# Patient Record
Sex: Male | Born: 2000 | Race: White | Hispanic: No | Smoking: Never smoker
Health system: Southern US, Community
[De-identification: ages and names within clinical notes are randomized; demographics above are authoritative.]

## PROBLEM LIST (undated history)

## (undated) DIAGNOSIS — J45909 Unspecified asthma, uncomplicated: Secondary | ICD-10-CM

## (undated) DIAGNOSIS — T7840XA Allergy, unspecified, initial encounter: Secondary | ICD-10-CM

## (undated) HISTORY — PX: FRACTURE SURGERY: SHX138

## (undated) HISTORY — DX: Allergy, unspecified, initial encounter: T78.40XA

## (undated) HISTORY — DX: Unspecified asthma, uncomplicated: J45.909

---
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
## 2020-01-22 ENCOUNTER — Ambulatory Visit (INDEPENDENT_AMBULATORY_CARE_PROVIDER_SITE_OTHER): Payer: 59 | Admitting: Nurse Practitioner

## 2020-01-22 ENCOUNTER — Other Ambulatory Visit: Payer: Self-pay

## 2020-01-22 ENCOUNTER — Encounter: Payer: Self-pay | Admitting: Nurse Practitioner

## 2020-01-22 VITALS — BP 125/79 | HR 83 | Temp 97.2°F | Ht 72.0 in | Wt 219.0 lb

## 2020-01-22 DIAGNOSIS — Z7689 Persons encountering health services in other specified circumstances: Secondary | ICD-10-CM

## 2020-01-22 DIAGNOSIS — J309 Allergic rhinitis, unspecified: Secondary | ICD-10-CM | POA: Diagnosis not present

## 2020-01-22 DIAGNOSIS — J4540 Moderate persistent asthma, uncomplicated: Secondary | ICD-10-CM | POA: Insufficient documentation

## 2020-01-22 DIAGNOSIS — J4541 Moderate persistent asthma with (acute) exacerbation: Secondary | ICD-10-CM | POA: Insufficient documentation

## 2020-01-22 DIAGNOSIS — J45909 Unspecified asthma, uncomplicated: Secondary | ICD-10-CM | POA: Insufficient documentation

## 2020-01-22 DIAGNOSIS — J4530 Mild persistent asthma, uncomplicated: Secondary | ICD-10-CM | POA: Diagnosis not present

## 2020-01-22 DIAGNOSIS — T7840XA Allergy, unspecified, initial encounter: Secondary | ICD-10-CM | POA: Insufficient documentation

## 2020-01-22 DIAGNOSIS — K219 Gastro-esophageal reflux disease without esophagitis: Secondary | ICD-10-CM | POA: Diagnosis not present

## 2020-01-22 MED ORDER — MONTELUKAST SODIUM 10 MG PO TABS: 10.0000 mg | ORAL_TABLET | Freq: Every day | ORAL | 1 refills | Status: DC

## 2020-01-22 MED ORDER — PANTOPRAZOLE SODIUM 40 MG PO TBEC: 40.0000 mg | DELAYED_RELEASE_TABLET | Freq: Every day | ORAL | 1 refills | Status: DC

## 2020-01-22 MED ORDER — METHYLPREDNISOLONE 4 MG PO TBPK: ORAL_TABLET | ORAL | 0 refills | Status: DC

## 2020-01-22 NOTE — Progress Notes (Signed)
New Patient Office Visit  Subjective:  Patient ID: Brandon Bishop, male    DOB: 16-Mar-2000  Age: 20 y.o. MRN: [id number]  CC:  Chief Complaint  Patient presents with  . Establish Care    HPI Brandon Bishop presents for new patient visit to establish care.  Introduced to Designer, jewellery role and practice setting.  All questions answered.  Discussed provider/patient relationship and expectations.   GERD Reports he never had any issues until he was diagnosed with COVID in July 2020.  The daily PPI prevents a chronic cough. GERD control status: controlled Satisfied with current treatment? yes Heartburn frequency: none with medicine Medication side effects: no  Medication compliance: excellent Previous GERD medications: pantoprazole Antacid use frequency:  never Duration: never Heartburn duration: never Alleviatiating factors:  Taking PPI Aggravating factors: not taking PPI Dysphagia: no Odynophagia:  no  Hematemesis: no Blood in stool: no EGD: no  ASTHMA Asthma status: better Satisfied with current treatment?: yes Albuterol/rescue inhaler frequency: about once per week Dyspnea frequency: about once per week Wheezing frequency: no Cough frequency: daily Nocturnal symptom frequency: yes; when lay down Limitation of activity: sometimes Current upper respiratory symptoms: no Aerochamber/spacer use: yes Visits to ER or Urgent Care in past year: no Pneumovax: Not up to Date Influenza: Not up to Date  Past Medical History:  Diagnosis Date  . Allergy    [facility]ia 01/21/2020  . Asthma    [facility]ia 01/21/2020    Past Surgical History:  Procedure Laterality Date  . FRACTURE SURGERY N/A    sesmoid bone  . WISDOM TOOTH EXTRACTION  2018   all 4 removed    Family History  Problem Relation Age of Onset  . Hypertension Maternal Grandmother   . Diabetes Mellitus II Paternal Grandfather     Social History   Socioeconomic History  . Marital status: Single    Spouse  name: Not on file  . Number of children: Not on file  . Years of education: Not on file  . Highest education level: Not on file  Occupational History  . Not on file  Tobacco Use  . Smoking status: Never Smoker  . Smokeless tobacco: Never Used  Substance and Sexual Activity  . Alcohol use: Never  . Drug use: Never  . Sexual activity: Not Currently  Other Topics Concern  . Not on file  Social History Narrative  . Not on file   Social Determinants of Health   Financial Resource Strain: Not on file  Food Insecurity: Not on file  Transportation Needs: Not on file  Physical Activity: Not on file  Stress: Not on file  Social Connections: Not on file  Intimate Partner Violence: Not on file    ROS Review of Systems  Constitutional: Negative.   HENT: Negative.   Eyes: Negative.   Respiratory: Positive for cough. Negative for chest tightness, shortness of breath and wheezing.   Cardiovascular: Negative.  Negative for chest pain.  Gastrointestinal: Negative.   Skin: Negative.   Neurological: Negative.   Psychiatric/Behavioral: Negative.     Objective:   Today's Vitals: BP 125/79   Pulse 83   Temp (!) 97.2 F (36.2 C)   Ht 6' (1.829 m)   Wt 219 lb (99.3 kg)   SpO2 99%   BMI 29.70 kg/m   Physical Exam Vitals and nursing note reviewed.  Constitutional:      General: He is not in acute distress.    Appearance: Normal appearance. He is not toxic-appearing.  HENT:  Head: Normocephalic and atraumatic.     Right Ear: External ear normal.     Nose: Nose normal. No congestion.     Mouth/Throat:     Mouth: Mucous membranes are moist.     Pharynx: Oropharynx is clear.  Eyes:     General: No scleral icterus.    Extraocular Movements: Extraocular movements intact.  Cardiovascular:     Rate and Rhythm: Normal rate.     Heart sounds: Normal heart sounds. No murmur heard.   Pulmonary:     Effort: Pulmonary effort is normal. No respiratory distress.     Breath sounds:  Normal breath sounds. No wheezing, rhonchi or rales.  Abdominal:     General: Abdomen is flat. Bowel sounds are normal.  Musculoskeletal:     Cervical back: Normal range of motion.  Lymphadenopathy:     Cervical: No cervical adenopathy.  Skin:    General: Skin is warm and dry.     Capillary Refill: Capillary refill takes less than 2 seconds.     Coloration: Skin is not jaundiced or pale.     Findings: No erythema.  Neurological:     Mental Status: He is alert and oriented to person, place, and time.  Psychiatric:        Mood and Affect: Mood normal.        Behavior: Behavior normal.        Thought Content: Thought content normal.        Judgment: Judgment normal.     Assessment & Plan:   Problem List Items Addressed This Visit      Respiratory   Asthma - Primary    Chronic, ?current exacerbation with increased cough and use of rescue inhaler.  Will extend methylprednisolone dose pack and otherwise, continue regimen with flovent, SABA prn, and Singulair. May consider spirometry at future visit.  Return to clinic if rescue inhaler use does not decrease after steroids.  With any sudden onset of chest pain or shortness of breath, go to ER.      Relevant Medications   montelukast (SINGULAIR) 10 MG tablet   methylPREDNISolone (MEDROL DOSEPAK) 4 MG TBPK tablet   Allergic rhinitis    Chronic, stable.  Continue regimen of zyrtec/flonase.  If symptoms worsen, return to clinic.        Digestive   Gastroesophageal reflux disease    Chronic, stable on daily pantoprazole.  Refills given.  Discussed possibly tapering off in the future and using as needed to prevent future vitamin deficiencies.  Patient aware of triggers.  Return to clinic with any worsening symptoms.      Relevant Medications   pantoprazole (PROTONIX) 40 MG tablet    Other Visit Diagnoses    Encounter to establish care          Outpatient Encounter Medications as of 01/22/2020  Medication Sig  .  methylPREDNISolone (MEDROL DOSEPAK) 4 MG TBPK tablet Going to extend your dose pack.  Take 2 tonight.  Take 1 tomorrow before breakfast, 1 before lunch, 2 tomorrow night.  Same thing Sunday.  Monday, start taper down with original dose pack.  . cetirizine (ZYRTEC) 10 MG tablet Take 10 mg by mouth daily.  Marland Kitchen FLOVENT HFA 220 MCG/ACT inhaler Inhale 2 puffs into the lungs 2 (two) times daily.  . fluticasone (FLONASE) 50 MCG/ACT nasal spray Place 1 spray into both nostrils daily.  . montelukast (SINGULAIR) 10 MG tablet Take 1 tablet (10 mg total) by mouth daily.  . pantoprazole (PROTONIX)  40 MG tablet Take 1 tablet (40 mg total) by mouth daily.  . [DISCONTINUED] ARNUITY ELLIPTA 200 MCG/ACT AEPB Inhale 1 puff into the lungs daily.  . [DISCONTINUED] docusate sodium (COLACE) 100 MG capsule Take 1 capsule (100 mg total) by mouth 2 (two) times daily.  . [DISCONTINUED] ibuprofen (ADVIL) 800 MG tablet TAKE ONE TABLET BY MOUTH EVERY 8 HOURS AS NEEDED  . [DISCONTINUED] montelukast (SINGULAIR) 10 MG tablet Take 10 mg by mouth daily.  . [DISCONTINUED] pantoprazole (PROTONIX) 40 MG tablet Take 40 mg by mouth daily.  . [DISCONTINUED] promethazine (PHENERGAN) 12.5 MG tablet Take 1 tablet (12.5 mg total) by mouth every 8 (eight) hours as needed for nausea or vomiting.   No facility-administered encounter medications on file as of 01/22/2020.    Follow-up: Return in about 6 months (around 07/21/2020) for f/u with Dr. Tanya Nones.   Valentino Nose, NP

## 2020-01-22 NOTE — Patient Instructions (Signed)
Asthma Attack Prevention, Adult Although you may not be able to control the fact that you have asthma, you can take actions to prevent episodes of asthma (asthma attacks). These actions include:  Creating a written plan for managing and treating your asthma attacks (asthma action plan).  Monitoring your asthma.  Avoiding things that can irritate your airways or make your asthma symptoms worse (asthma triggers).  Taking your medicines as directed.  Acting quickly if you have signs or symptoms of an asthma attack. What are some ways to prevent an asthma attack? Create a plan Work with your health care provider to create an asthma action plan. This plan should include:  A list of your asthma triggers and how to avoid them.  A list of symptoms that you experience during an asthma attack.  Information about when to take medicine and how much medicine to take.  Information to help you understand your peak flow measurements.  Contact information for your health care providers.  Daily actions that you can take to control asthma. Monitor your asthma To monitor your asthma:  Use your peak flow meter every morning and every evening for 2-3 weeks. Record the results in a journal. A drop in your peak flow numbers on one or more days may mean that you are starting to have an asthma attack, even if you are not having symptoms.  When you have asthma symptoms, write them down in a journal.  Avoid asthma triggers Work with your health care provider to find out what your asthma triggers are. This can be done by:  Being tested for allergies.  Keeping a journal that notes when asthma attacks occur and what may have contributed to them.  Asking your health care provider whether other medical conditions make your asthma worse. Common asthma triggers include:  Dust.  Smoke. This includes campfire smoke and secondhand smoke from tobacco products.  Pet dander.  Trees, grasses or  pollens.  Very cold, dry, or humid air.  Mold.  Foods that contain high amounts of sulfites.  Strong smells.  Engine exhaust and air pollution.  Aerosol sprays and fumes from household cleaners.  Household pests and their droppings, including dust mites and cockroaches.  Certain medicines, including NSAIDs. Once you have determined your asthma triggers, take steps to avoid them. Depending on your triggers, you may be able to reduce the chance of an asthma attack by:  Keeping your home clean. Have someone dust and vacuum your home for you 1 or 2 times a week. If possible, have them use a high-efficiency particulate arrestance (HEPA) vacuum.  Washing your sheets weekly in hot water.  Using allergy-proof mattress covers and casings on your bed.  Keeping pets out of your home.  Taking care of mold and water problems in your home.  Avoiding areas where people smoke.  Avoiding using strong perfumes or odor sprays.  Avoid spending a lot of time outdoors when pollen counts are high and on very windy days.  Talking with your health care provider before stopping or starting any new medicines. Medicines Take over-the-counter and prescription medicines only as told by your health care provider. Many asthma attacks can be prevented by carefully following your medicine schedule. Taking your medicines correctly is especially important when you cannot avoid certain asthma triggers. Even if you are doing well, do not stop taking your medicine and do not take less medicine. Act quickly If an asthma attack happens, acting quickly can decrease how severe it is and   how long it lasts. Take these actions:  Pay attention to your symptoms. If you are coughing, wheezing, or having difficulty breathing, do not wait to see if your symptoms go away on their own. Follow your asthma action plan.  If you have followed your asthma action plan and your symptoms are not improving, call your health care  provider or seek immediate medical care at the nearest hospital. It is important to write down how often you need to use your fast-acting rescue inhaler. You can track how often you use an inhaler in your journal. If you are using your rescue inhaler more often, it may mean that your asthma is not under control. Adjusting your asthma treatment plan may help you to prevent future asthma attacks and help you to gain better control of your condition. How can I prevent an asthma attack when I exercise? Exercise is a common asthma trigger. To prevent asthma attacks during exercise:  Follow advice from your health care provider about whether you should use your fast-acting inhaler before exercising. Many people with asthma experience exercise-induced bronchoconstriction (EIB). This condition often worsens during vigorous exercise in cold, humid, or dry environments. Usually, people with EIB can stay very active by using a fast-acting inhaler before exercising.  Avoid exercising outdoors in very cold or humid weather.  Avoid exercising outdoors when pollen counts are high.  Warm up and cool down when exercising.  Stop exercising right away if asthma symptoms start. Consider taking part in exercises that are less likely to cause asthma symptoms such as:  Indoor swimming.  Biking.  Walking.  Hiking.  Playing football. This information is not intended to replace advice given to you by your health care provider. Make sure you discuss any questions you have with your health care provider. Document Revised: 12/14/2016 Document Reviewed: 06/18/2015 Elsevier Patient Education  2020 Elsevier Inc.  

## 2020-01-25 NOTE — Assessment & Plan Note (Signed)
Chronic, ?current exacerbation with increased cough and use of rescue inhaler.  Will extend methylprednisolone dose pack and otherwise, continue regimen with flovent, SABA prn, and Singulair. May consider spirometry at future visit.  Return to clinic if rescue inhaler use does not decrease after steroids.  With any sudden onset of chest pain or shortness of breath, go to ER.

## 2020-01-25 NOTE — Assessment & Plan Note (Signed)
Chronic, stable.  Continue regimen of zyrtec/flonase.  If symptoms worsen, return to clinic.

## 2020-01-25 NOTE — Assessment & Plan Note (Addendum)
Chronic, stable on daily pantoprazole.  Refills given.  Discussed possibly tapering off in the future and using as needed to prevent future vitamin deficiencies.  Patient aware of triggers.  Return to clinic with any worsening symptoms.

## 2020-03-09 ENCOUNTER — Encounter: Payer: Self-pay | Admitting: Family Medicine

## 2020-03-10 ENCOUNTER — Encounter: Payer: Self-pay | Admitting: Family Medicine

## 2020-05-23 ENCOUNTER — Encounter: Payer: Self-pay | Admitting: Nurse Practitioner

## 2020-05-23 ENCOUNTER — Other Ambulatory Visit: Payer: Self-pay

## 2020-05-23 ENCOUNTER — Ambulatory Visit (INDEPENDENT_AMBULATORY_CARE_PROVIDER_SITE_OTHER): Payer: 59 | Admitting: Nurse Practitioner

## 2020-05-23 VITALS — BP 120/84 | HR 97 | Temp 98.1°F | Ht 72.04 in | Wt 228.8 lb

## 2020-05-23 DIAGNOSIS — J029 Acute pharyngitis, unspecified: Secondary | ICD-10-CM

## 2020-05-23 DIAGNOSIS — K219 Gastro-esophageal reflux disease without esophagitis: Secondary | ICD-10-CM

## 2020-05-23 DIAGNOSIS — J4530 Mild persistent asthma, uncomplicated: Secondary | ICD-10-CM | POA: Diagnosis not present

## 2020-05-23 DIAGNOSIS — J189 Pneumonia, unspecified organism: Secondary | ICD-10-CM

## 2020-05-23 DIAGNOSIS — J309 Allergic rhinitis, unspecified: Secondary | ICD-10-CM

## 2020-05-23 HISTORY — DX: Pneumonia, unspecified organism: J18.9

## 2020-05-23 HISTORY — DX: Acute pharyngitis, unspecified: J02.9

## 2020-05-23 MED ORDER — FLOVENT HFA 220 MCG/ACT IN AERO: 2.0000 | INHALATION_SPRAY | Freq: Two times a day (BID) | RESPIRATORY_TRACT | 2 refills | Status: DC

## 2020-05-23 MED ORDER — PANTOPRAZOLE SODIUM 40 MG PO TBEC: 40.0000 mg | DELAYED_RELEASE_TABLET | Freq: Every day | ORAL | 1 refills | Status: DC

## 2020-05-23 MED ORDER — ALBUTEROL SULFATE (2.5 MG/3ML) 0.083% IN NEBU: 2.5000 mg | INHALATION_SOLUTION | Freq: Four times a day (QID) | RESPIRATORY_TRACT | 2 refills | Status: DC

## 2020-05-23 MED ORDER — AMOXICILLIN-POT CLAVULANATE 875-125 MG PO TABS: 1.0000 | ORAL_TABLET | Freq: Two times a day (BID) | ORAL | 0 refills | Status: AC

## 2020-05-23 MED ORDER — MONTELUKAST SODIUM 10 MG PO TABS: 10.0000 mg | ORAL_TABLET | Freq: Every day | ORAL | 1 refills | Status: DC

## 2020-05-23 NOTE — Assessment & Plan Note (Signed)
Chronic.  Stable with use of daily Flovent and albuterol as needed.  Also taking taking Singulair-refills given given.  Follow-up in 6 months.

## 2020-05-23 NOTE — Assessment & Plan Note (Signed)
Chronic.  Stable with use of Zyrtec and Flonase regimen.  Continue these medications.  Return to clinic if symptoms worsen.

## 2020-05-23 NOTE — Assessment & Plan Note (Signed)
Acute.  Unclear etiology-likely either strep throat versus viral pharyngitis.  Strep culture obtained and respiratory panel obtained.  Given white exudate on tonsils, erythematous auditory canals bilaterally, and ongoing fever for more than 3 days, suspected bacterial.  We will treat with Augmentin twice daily for 10 days.  Follow-up near end of week if symptoms or not improving-he leaves to go out of the country for 3 months next week, prefer to be fully treated prior to then.

## 2020-05-23 NOTE — Assessment & Plan Note (Signed)
Do not have access to records from [facility]-however, is due for repeat chest x-ray to ensure pneumonia has fully resolved.  We will obtain this today.

## 2020-05-23 NOTE — Assessment & Plan Note (Signed)
Chronic.  Stable on pantoprazole 40 mg daily.  We will continue this medication for now-refills given.  Follow-up in 6 months.

## 2020-05-23 NOTE — Progress Notes (Signed)
Subjective:    Patient ID: Brandon Bishop, male    DOB: [birth year], 20 y.o.   MRN: [id number]  HPI: Brandon Bishop is a 20 y.o. male presenting for sore throat.   Chief Complaint  Patient presents with  . Sore Throat    Fever for the past 2 days. Home test - 2 days ago  . Coronary Artery Disease   UPPER RESPIRATORY TRACT INFECTION COVID testing history: negative at home test COVID vaccination status: fully vaccinated with Pfizer Onset: Fever: started Saturday 101.9  Cough: no Shortness of breath: no Wheezing: no Chest pain: no Chest tightness: yes; worse at night time sort of Chest congestion: no Nasal congestion: yes Runny nose: no Post nasal drip: yes Sneezing: no Sore throat: yes Swollen glands: yes Sinus pressure: yes; maxillary Headache: yes Face pain: no Toothache: yes, when drinking cold water Ear pain: no  Ear pressure: no  Eyes red/itching:no Eye drainage/crusting: no  Nausea: yes  Vomiting: no Diarrhea: yes  Change in appetite: yes; throat hurts too much  Loss of taste/smell: no  Rash: no Fatigue: yes Sick contacts: no Strep contacts: no  Context: better Recurrent sinusitis: no Treatments attempted: Ibuprofen,  Relief with OTC medications: yes  Patient reports about 1 month ago, he had a cough at college and was found to have bilateral pneumonia.  He was treated with amoxicillin for this he reports an his symptoms got all of the way better.  Patient also reports he is going out of the country to [place] for the summer next Wednesday and is asking for refills of all of his medication.  Allergies  Allergen Reactions  . Other   . Dust Mite Extract   . Grass Pollen(K-O-R-T-Swt Vern)   . Mixed Feathers   . Molds & Smuts     Outpatient Encounter Medications as of 05/23/2020  Medication Sig  . amoxicillin-clavulanate (AUGMENTIN) 875-125 MG tablet Take 1 tablet by mouth 2 (two) times daily for 10 days.  . cetirizine (ZYRTEC) 10 MG tablet Take 10 mg by  mouth daily.  . fluticasone (FLONASE) 50 MCG/ACT nasal spray Place 1 spray into both nostrils daily.  . [DISCONTINUED] albuterol (PROVENTIL) (2.5 MG/3ML) 0.083% nebulizer solution Take 2.5 mg by nebulization 4 (four) times daily.  . [DISCONTINUED] FLOVENT HFA 220 MCG/ACT inhaler Inhale 2 puffs into the lungs 2 (two) times daily.  . [DISCONTINUED] montelukast (SINGULAIR) 10 MG tablet Take 1 tablet (10 mg total) by mouth daily.  . [DISCONTINUED] pantoprazole (PROTONIX) 40 MG tablet Take 1 tablet (40 mg total) by mouth daily.  Marland Kitchen albuterol (PROVENTIL) (2.5 MG/3ML) 0.083% nebulizer solution Take 3 mLs (2.5 mg total) by nebulization 4 (four) times daily.  Marland Kitchen FLOVENT HFA 220 MCG/ACT inhaler Inhale 2 puffs into the lungs 2 (two) times daily.  . montelukast (SINGULAIR) 10 MG tablet Take 1 tablet (10 mg total) by mouth daily.  . pantoprazole (PROTONIX) 40 MG tablet Take 1 tablet (40 mg total) by mouth daily.  . [DISCONTINUED] methylPREDNISolone (MEDROL DOSEPAK) 4 MG TBPK tablet Going to extend your dose pack.  Take 2 tonight.  Take 1 tomorrow before breakfast, 1 before lunch, 2 tomorrow night.  Same thing Sunday.  Monday, start taper down with original dose pack.   No facility-administered encounter medications on file as of 05/23/2020.    Patient Active Problem List   Diagnosis Date Noted  . Sore throat 05/23/2020  . Pneumonia of both lungs due to infectious organism 05/23/2020  . Gastroesophageal reflux disease 01/22/2020  .  Asthma   . Allergic rhinitis     Past Medical History:  Diagnosis Date  . Allergy    [facility] 01/21/2020  . Asthma    [facility] 01/21/2020    Relevant past medical, surgical, family and social history reviewed and updated as indicated. Interim medical history since our last visit reviewed.  Review of Systems Per HPI unless specifically indicated above     Objective:    BP 120/84   Pulse 97   Temp 98.1 F (36.7 C)   Ht 6' 0.04" (1.83 m)   Wt 228 lb 12.8 oz (103.8  kg)   SpO2 97%   BMI 31.00 kg/m   Wt Readings from Last 3 Encounters:  05/23/20 228 lb 12.8 oz (103.8 kg) (98 %, Z= 2.05)*  01/22/20 219 lb (99.3 kg) (97 %, Z= 1.88)*   * Growth percentiles are based on CDC (Boys, 2-20 Years) data.    Physical Exam Vitals and nursing note reviewed.  Constitutional:      General: He is not in acute distress.    Appearance: He is well-developed. He is not toxic-appearing.  HENT:     Head: Normocephalic and atraumatic.     Right Ear: Tympanic membrane normal. Tympanic membrane is not erythematous.     Left Ear: Tympanic membrane normal. Tympanic membrane is not erythematous.     Ears:     Comments: Erythematous canals bilaterally    Nose: Congestion present. No rhinorrhea.     Mouth/Throat:     Mouth: Mucous membranes are moist.     Pharynx: Pharyngeal swelling, oropharyngeal exudate and posterior oropharyngeal erythema present.     Tonsils: Tonsillar exudate present.  Eyes:     Extraocular Movements:     Right eye: Normal extraocular motion.     Left eye: Normal extraocular motion.     Conjunctiva/sclera: Conjunctivae normal.  Neck:     Thyroid: No thyromegaly.  Cardiovascular:     Rate and Rhythm: Normal rate and regular rhythm.     Heart sounds: Normal heart sounds. No murmur heard.   Pulmonary:     Effort: Pulmonary effort is normal. No respiratory distress.     Breath sounds: Normal breath sounds. No wheezing, rhonchi or rales.  Abdominal:     General: Bowel sounds are normal.     Palpations: Abdomen is soft.  Musculoskeletal:     Cervical back: Normal range of motion and neck supple.  Lymphadenopathy:     Cervical: Cervical adenopathy present.  Skin:    General: Skin is warm.     Capillary Refill: Capillary refill takes less than 2 seconds.     Coloration: Skin is not pale.     Findings: No erythema or rash.  Neurological:     Mental Status: He is alert and oriented to person, place, and time.  Psychiatric:        Mood and  Affect: Mood normal.        Behavior: Behavior normal.    Results for orders placed or performed in visit on 05/23/20  STREP GROUP A AG, W/REFLEX TO CULT   Specimen: Throat  Result Value Ref Range   Streptococcus Group A AG NOT DETECTED NOT DETECTED      Assessment & Plan:   Problem List Items Addressed This Visit      Respiratory   Pneumonia of both lungs due to infectious organism    Do not have access to records from [employer]-however, is due for repeat chest x-ray to  ensure pneumonia has fully resolved.  We will obtain this today.      Relevant Medications   montelukast (SINGULAIR) 10 MG tablet   FLOVENT HFA 220 MCG/ACT inhaler   albuterol (PROVENTIL) (2.5 MG/3ML) 0.083% nebulizer solution   amoxicillin-clavulanate (AUGMENTIN) 875-125 MG tablet   Other Relevant Orders   DG Chest 2 View   Asthma    Chronic.  Stable with use of daily Flovent and albuterol as needed.  Also taking taking Singulair-refills given given.  Follow-up in 6 months.      Relevant Medications   montelukast (SINGULAIR) 10 MG tablet   FLOVENT HFA 220 MCG/ACT inhaler   albuterol (PROVENTIL) (2.5 MG/3ML) 0.083% nebulizer solution   Allergic rhinitis    Chronic.  Stable with use of Zyrtec and Flonase regimen.  Continue these medications.  Return to clinic if symptoms worsen.        Digestive   Gastroesophageal reflux disease    Chronic.  Stable on pantoprazole 40 mg daily.  We will continue this medication for now-refills given.  Follow-up in 6 months.      Relevant Medications   pantoprazole (PROTONIX) 40 MG tablet     Other   Sore throat - Primary    Acute.  Unclear etiology-likely either strep throat versus viral pharyngitis.  Strep culture obtained and respiratory panel obtained.  Given white exudate on tonsils, erythematous auditory canals bilaterally, and ongoing fever for more than 3 days, suspected bacterial.  We will treat with Augmentin twice daily for 10 days.  Follow-up near end of  week if symptoms or not improving-he leaves to go out of the country for 3 months next week, prefer to be fully treated prior to then.      Relevant Medications   amoxicillin-clavulanate (AUGMENTIN) 875-125 MG tablet   Other Relevant Orders   STREP GROUP A AG, W/REFLEX TO CULT (Completed)   SARS-CoV-2 RNA (COVID-19) and Respiratory Viral Panel, Qualitative NAAT       Follow up plan: Return for If symptoms not improving near the end of the week.

## 2020-05-24 ENCOUNTER — Other Ambulatory Visit: Payer: Self-pay

## 2020-05-24 NOTE — Telephone Encounter (Signed)
Ok to send albuterol inhaler to pt pharmacy?

## 2020-05-25 LAB — SARS-COV-2 RNA (COVID-19) RESP VIRAL PNL QL NAAT

## 2020-05-25 LAB — CULTURE, GROUP A STREP
MICRO NUMBER:: 11865512
SPECIMEN QUALITY:: ADEQUATE

## 2020-05-25 LAB — STREP GROUP A AG, W/REFLEX TO CULT: Streptococcus Group A AG: NOT DETECTED

## 2020-05-25 MED ORDER — ALBUTEROL SULFATE HFA 108 (90 BASE) MCG/ACT IN AERS: 2.0000 | INHALATION_SPRAY | Freq: Four times a day (QID) | RESPIRATORY_TRACT | 11 refills | Status: DC | PRN

## 2020-06-13 ENCOUNTER — Encounter: Payer: Self-pay | Admitting: Nurse Practitioner

## 2020-08-15 ENCOUNTER — Ambulatory Visit
Admission: RE | Admit: 2020-08-15 | Discharge: 2020-08-15 | Disposition: A | Discharge status: Home / Self Care | Payer: BLUE CROSS/BLUE SHIELD | Source: Ambulatory Visit | Attending: Nurse Practitioner | Admitting: Nurse Practitioner

## 2020-08-15 ENCOUNTER — Other Ambulatory Visit: Payer: Self-pay

## 2020-08-15 DIAGNOSIS — J189 Pneumonia, unspecified organism: Secondary | ICD-10-CM

## 2020-08-17 ENCOUNTER — Other Ambulatory Visit: Payer: Self-pay

## 2020-08-17 ENCOUNTER — Telehealth (INDEPENDENT_AMBULATORY_CARE_PROVIDER_SITE_OTHER): Payer: 59 | Admitting: Nurse Practitioner

## 2020-08-17 ENCOUNTER — Encounter: Payer: Self-pay | Admitting: Nurse Practitioner

## 2020-08-17 DIAGNOSIS — J4530 Mild persistent asthma, uncomplicated: Secondary | ICD-10-CM | POA: Diagnosis not present

## 2020-08-17 DIAGNOSIS — J0100 Acute maxillary sinusitis, unspecified: Secondary | ICD-10-CM

## 2020-08-17 MED ORDER — PREDNISONE 10 MG PO TABS
10.0000 mg | ORAL_TABLET | Freq: Every day | ORAL | 0 refills | Status: DC
Start: 2020-08-17 — End: 2020-08-24

## 2020-08-17 MED ORDER — AMOXICILLIN-POT CLAVULANATE 875-125 MG PO TABS: 1.0000 | ORAL_TABLET | Freq: Two times a day (BID) | ORAL | 0 refills | Status: DC

## 2020-08-17 NOTE — Assessment & Plan Note (Signed)
Chronic.  I wonder if asthma is exacerbated because patient is not using Flovent twice daily.  Encouraged him to use twice daily and rinse mouth out after each use.  We will plan to follow-up next week before he leaves again for college to see how his breathing is doing.  His recent chest x-ray did not show any left over pneumonia, which is encouraging.

## 2020-08-17 NOTE — Progress Notes (Signed)
Subjective:    Patient ID: Brandon Bishop, male    DOB: 2000-03-23, 20 y.o.   MRN: [id number]  HPI: Brandon Bishop is a 20 y.o. male presenting virtually for ongoing cough.  Chief Complaint  Patient presents with   Cough    Discuss results from chest xray. Ongoing cough for most of summer months, given antibx before out of country trip, while away given more antibx by doctor in [place], also having drainage and productive cough. Taking rx meds for sx.   COUGH Is preventing him from sleeping at night.  Reports albuterol nebulizer is not really helping. Duration: weeks Circumstances of initial development of cough: URI Cough severity: severe Cough description: dry and also productive with green/yellow phlegm Aggravating factors: laying down Alleviating factors: nothing Status: worse Treatments attempted: antibiotics in [place], all maintenance medications. Wheezing: no Shortness of breath: no Chest pain: no Chest tightness:yes Nasal congestion: yes Runny nose: yes Postnasal drip: no Frequent throat clearing or swallowing: yes Hemoptysis: no Fevers: no Night sweats: yes Weight loss: no Heartburn: no Recent foreign travel: yes; [place]  Tuberculosis contacts: no  Allergies  Allergen Reactions   Other    Dust Mite Extract    Grass Pollen(K-O-R-T-Swt Vern)    Mixed Feathers    Molds & Smuts     Outpatient Encounter Medications as of 08/17/2020  Medication Sig   albuterol (PROVENTIL) (2.5 MG/3ML) 0.083% nebulizer solution Take 3 mLs (2.5 mg total) by nebulization 4 (four) times daily.   albuterol (VENTOLIN HFA) 108 (90 Base) MCG/ACT inhaler Inhale 2 puffs into the lungs every 6 (six) hours as needed for wheezing or shortness of breath.   amoxicillin-clavulanate (AUGMENTIN) 875-125 MG tablet Take 1 tablet by mouth 2 (two) times daily for 7 days.   cetirizine (ZYRTEC) 10 MG tablet Take 10 mg by mouth daily.   FLOVENT HFA 220 MCG/ACT inhaler Inhale 2 puffs into the lungs 2 (two)  times daily.   fluticasone (FLONASE) 50 MCG/ACT nasal spray Place 1 spray into both nostrils daily.   montelukast (SINGULAIR) 10 MG tablet Take 1 tablet (10 mg total) by mouth daily.   pantoprazole (PROTONIX) 40 MG tablet Take 1 tablet (40 mg total) by mouth daily.   predniSONE (DELTASONE) 10 MG tablet Take 1 tablet (10 mg total) by mouth daily with breakfast. Take 40mg  on days 1-2. Take 30mg  on days 3-4. Take 20mg  on days 5-6. Take 10mg  on days 7-8. Take 5mg  on days 9-10, then stop.   No facility-administered encounter medications on file as of 08/17/2020.    Patient Active Problem List   Diagnosis Date Noted   Sore throat 05/23/2020   Gastroesophageal reflux disease 01/22/2020   Asthma    Allergic rhinitis     Past Medical History:  Diagnosis Date   Allergy    Phreesia 01/21/2020   Asthma    Phreesia 01/21/2020   Pneumonia of both lungs due to infectious organism 05/23/2020    Relevant past medical, surgical, family and social history reviewed and updated as indicated. Interim medical history since our last visit reviewed.  Review of Systems Per HPI unless specifically indicated above     Objective:    There were no vitals taken for this visit.  Wt Readings from Last 3 Encounters:  05/23/20 228 lb 12.8 oz (103.8 kg) (98 %, Z= 2.05)*  01/22/20 219 lb (99.3 kg) (97 %, Z= 1.88)*   * Growth percentiles are based on CDC (Boys, 2-20 Years) data.  Physical Exam Vitals and nursing note reviewed.  Constitutional:      General: He is not in acute distress.    Appearance: He is not ill-appearing or toxic-appearing.  HENT:     Head: Normocephalic and atraumatic.     Right Ear: External ear normal.     Left Ear: External ear normal.     Nose: Congestion present. No rhinorrhea.     Mouth/Throat:     Mouth: Mucous membranes are moist.     Pharynx: Oropharynx is clear.  Eyes:     General: No scleral icterus.    Extraocular Movements: Extraocular movements intact.   Cardiovascular:     Comments: Unable to assess heart sounds via virtual visit. Pulmonary:     Effort: Pulmonary effort is normal. No respiratory distress.     Comments: Unable to assess breath sounds via virtual visit.  Patient talking in complete sentences during telemedicine visit without accessory muscle use. Skin:    Coloration: Skin is not jaundiced or pale.     Findings: No erythema.  Neurological:     Mental Status: He is alert and oriented to person, place, and time.  Psychiatric:        Mood and Affect: Mood normal.        Behavior: Behavior normal.        Thought Content: Thought content normal.        Judgment: Judgment normal.       Assessment & Plan:   Problem List Items Addressed This Visit       Respiratory   Asthma    Chronic.  I wonder if asthma is exacerbated because patient is not using Flovent twice daily.  Encouraged him to use twice daily and rinse mouth out after each use.  We will plan to follow-up next week before he leaves again for college to see how his breathing is doing.  His recent chest x-ray did not show any left over pneumonia, which is encouraging.       Relevant Medications   predniSONE (DELTASONE) 10 MG tablet   Other Visit Diagnoses     Acute non-recurrent maxillary sinusitis    -  Primary   Relevant Medications   amoxicillin-clavulanate (AUGMENTIN) 875-125 MG tablet   predniSONE (DELTASONE) 10 MG tablet     We will treat for presumed bacterial sinus infection with Augmentin and prednisone.  Encouraged plenty of hydration with water, treating other symptoms with over-the-counter's.  Plan to follow-up next week.  Recent chest x-ray did not show pneumonia.  Follow up plan: Return in about 1 week (around 08/24/2020) for in person to reassess.  Due to the catastrophic nature of the COVID-19 pandemic, this video visit was completed soley via audio and visual contact via Caregility due to the restrictions of the COVID-19 pandemic.  All  issues as above were discussed and addressed. Physical exam was done as above through visual confirmation on Caregility. If it was felt that the patient should be evaluated in the office, they were directed there. The patient verbally consented to this visit. Location of the patient: home Location of the provider: work Those involved with this call:  Provider: Cathlean Marseilles, DNP, FNP-C CMA: Moises Blood, CMA Front Desk/Registration: Percival Spanish  Time spent on call:  10 minutes with patient face to face via video conference. More than 50% of this time was spent in counseling and coordination of care. 15 minutes total spent in review of patient's record and preparation of their chart.  I verified patient identity using two factors (patient name and date of birth). Patient consents verbally to being seen via telemedicine visit today.

## 2020-08-22 NOTE — Progress Notes (Signed)
Subjective:    Patient ID: Brandon Bishop, male    DOB: 11/27/2000, 19 y.o.   MRN: [id number]  HPI: Brandon Bishop is a 20 y.o. male presenting for breathing follow up.  Chief Complaint  Patient presents with   Follow-up    Feeling much better with the prednisone tx    ASTHMA Has been trying to take Flovent twice daily.  Has a hard time remembering morning dose.  Does not do well with powder inhalers. Asthma status: stable Satisfied with current treatment?: yes Albuterol/rescue inhaler frequency: not using Dyspnea frequency:  none Wheezing frequency: no Cough frequency: a few times at night, much improved Nocturnal symptom frequency: yes Limitation of activity: no Current upper respiratory symptoms: coughing up phlegm at night Asthma meds in past: Singulair every day, flovent twice daily, and albuterol as needed Aerochamber/spacer use: no Visits to ER or Urgent Care in past year: no  Allergies  Allergen Reactions   Other    Dust Mite Extract    Grass Pollen(K-O-R-T-Swt Vern)    Mixed Feathers    Molds & Smuts     Outpatient Encounter Medications as of 08/24/2020  Medication Sig   albuterol (PROVENTIL) (2.5 MG/3ML) 0.083% nebulizer solution Take 3 mLs (2.5 mg total) by nebulization 4 (four) times daily.   albuterol (VENTOLIN HFA) 108 (90 Base) MCG/ACT inhaler Inhale 2 puffs into the lungs every 6 (six) hours as needed for wheezing or shortness of breath.   cetirizine (ZYRTEC) 10 MG tablet Take 10 mg by mouth daily.   FLOVENT HFA 220 MCG/ACT inhaler Inhale 2 puffs into the lungs 2 (two) times daily.   fluticasone (FLONASE) 50 MCG/ACT nasal spray Place 1 spray into both nostrils daily.   montelukast (SINGULAIR) 10 MG tablet Take 1 tablet (10 mg total) by mouth daily.   pantoprazole (PROTONIX) 40 MG tablet Take 1 tablet (40 mg total) by mouth daily.   [DISCONTINUED] amoxicillin-clavulanate (AUGMENTIN) 875-125 MG tablet Take 1 tablet by mouth 2 (two) times daily for 7 days.    [DISCONTINUED] predniSONE (DELTASONE) 10 MG tablet Take 1 tablet (10 mg total) by mouth daily with breakfast. Take 40mg  on days 1-2. Take 30mg  on days 3-4. Take 20mg  on days 5-6. Take 10mg  on days 7-8. Take 5mg  on days 9-10, then stop.   No facility-administered encounter medications on file as of 08/24/2020.    Patient Active Problem List   Diagnosis Date Noted   Gastroesophageal reflux disease 01/22/2020   Asthma    Allergic rhinitis     Past Medical History:  Diagnosis Date   Allergy    Phreesia 01/21/2020   Asthma    Phreesia 01/21/2020   Pneumonia of both lungs due to infectious organism 05/23/2020   Sore throat 05/23/2020    Relevant past medical, surgical, family and social history reviewed and updated as indicated. Interim medical history since our last visit reviewed.  Review of Systems Per HPI unless specifically indicated above     Objective:    BP 122/74   Pulse 81   Temp 98.5 F (36.9 C)   Ht 6' 0.06" (1.83 m)   Wt 211 lb 12.8 oz (96.1 kg)   SpO2 97%   BMI 28.68 kg/m   Wt Readings from Last 3 Encounters:  08/24/20 211 lb 12.8 oz (96.1 kg) (95 %, Z= 1.68)*  05/23/20 228 lb 12.8 oz (103.8 kg) (98 %, Z= 2.05)*  01/22/20 219 lb (99.3 kg) (97 %, Z= 1.88)*   * Growth percentiles  are based on CDC (Boys, 2-20 Years) data.    Physical Exam Vitals and nursing note reviewed.  Constitutional:      General: He is not in acute distress.    Appearance: Normal appearance. He is not toxic-appearing.  HENT:     Head: Normocephalic and atraumatic.     Right Ear: Tympanic membrane, ear canal and external ear normal.     Left Ear: Tympanic membrane, ear canal and external ear normal.     Nose: Nose normal. No congestion.     Mouth/Throat:     Mouth: Mucous membranes are moist.     Pharynx: Oropharynx is clear. No oropharyngeal exudate or posterior oropharyngeal erythema.  Cardiovascular:     Rate and Rhythm: Normal rate and regular rhythm.     Heart sounds: Normal  heart sounds. No murmur heard. Pulmonary:     Effort: Pulmonary effort is normal. No respiratory distress.     Breath sounds: Normal breath sounds. No wheezing, rhonchi or rales.  Musculoskeletal:     Cervical back: Normal range of motion.  Lymphadenopathy:     Cervical: No cervical adenopathy.  Skin:    General: Skin is warm and dry.     Capillary Refill: Capillary refill takes less than 2 seconds.     Coloration: Skin is not jaundiced or pale.     Findings: No erythema.  Neurological:     Mental Status: He is alert and oriented to person, place, and time.     Motor: No weakness.     Gait: Gait normal.  Psychiatric:        Mood and Affect: Mood normal.        Behavior: Behavior normal.        Thought Content: Thought content normal.        Judgment: Judgment normal.      Assessment & Plan:   Problem List Items Addressed This Visit       Respiratory   Asthma - Primary    Chronic.  Symptoms improved from last week.  Continue steroid to completion.  Encouraged twice daily use of flovent, continue use of all other medications including flonase, zyrtec, Singulair, and Protonix.  I recommended flu shot once it becomes available.  Follow up in 6 months or sooner if needed.         Follow up plan: Return if symptoms worsen or fail to improve.

## 2020-08-24 ENCOUNTER — Ambulatory Visit (INDEPENDENT_AMBULATORY_CARE_PROVIDER_SITE_OTHER): Payer: 59 | Admitting: Nurse Practitioner

## 2020-08-24 ENCOUNTER — Other Ambulatory Visit: Payer: Self-pay

## 2020-08-24 ENCOUNTER — Encounter: Payer: Self-pay | Admitting: Nurse Practitioner

## 2020-08-24 VITALS — BP 122/74 | HR 81 | Temp 98.5°F | Ht 72.06 in | Wt 211.8 lb

## 2020-08-24 DIAGNOSIS — J4530 Mild persistent asthma, uncomplicated: Secondary | ICD-10-CM | POA: Diagnosis not present

## 2020-08-24 NOTE — Assessment & Plan Note (Addendum)
Chronic.  Symptoms improved from last week.  Continue steroid to completion.  Encouraged twice daily use of flovent, continue use of all other medications including flonase, zyrtec, Singulair, and Protonix.  I recommended flu shot once it becomes available.  Follow up in 6 months or sooner if needed.

## 2020-08-28 ENCOUNTER — Encounter: Payer: Self-pay | Admitting: Nurse Practitioner

## 2020-08-28 DIAGNOSIS — J4540 Moderate persistent asthma, uncomplicated: Secondary | ICD-10-CM

## 2020-08-28 DIAGNOSIS — J4530 Mild persistent asthma, uncomplicated: Secondary | ICD-10-CM

## 2020-08-28 DIAGNOSIS — J309 Allergic rhinitis, unspecified: Secondary | ICD-10-CM

## 2020-08-29 MED ORDER — TRIAMCINOLONE ACETONIDE 55 MCG/ACT NA AERO
2.0000 | INHALATION_SPRAY | Freq: Every day | NASAL | 3 refills | Status: DC
Start: 2020-08-29 — End: 2022-02-23

## 2020-08-29 MED ORDER — LEVOCETIRIZINE DIHYDROCHLORIDE 5 MG PO TABS: 5.0000 mg | ORAL_TABLET | Freq: Every evening | ORAL | 1 refills | Status: DC

## 2020-08-29 MED ORDER — ADVAIR HFA 115-21 MCG/ACT IN AERO: 2.0000 | INHALATION_SPRAY | Freq: Two times a day (BID) | RESPIRATORY_TRACT | 3 refills | Status: DC

## 2020-08-29 MED ORDER — FLOVENT HFA 220 MCG/ACT IN AERO: 2.0000 | INHALATION_SPRAY | Freq: Two times a day (BID) | RESPIRATORY_TRACT | 2 refills | Status: DC

## 2020-08-29 NOTE — Addendum Note (Signed)
Addended by: Cathlean Marseilles A on: 08/29/2020 02:45 PM   Modules accepted: Orders

## 2020-09-03 IMAGING — MR MR FOOT*R* W/O CM
4 of 5 series · 21 of 40 positions shown · non-contrast
Comparison: Right foot x-rays dated June 23, 2019, and June 03, 2019.

CLINICAL DATA: Pain around the first MTP joint. Suspected lateral
hallux sesamoid fracture. The patient dropped a boulder on his foot
1 year ago. No prior surgery.

EXAM:
MRI OF THE RIGHT FOREFOOT WITHOUT CONTRAST
TECHNIQUE: Multiplanar, multisequence MR imaging of the right forefoot was
performed. No intravenous contrast was administered.

[Series 9: T1 · coronal · 3.0mm · 0.23mm/px · 3 of 44 slices shown (1 of 2)]
[im 5/44]
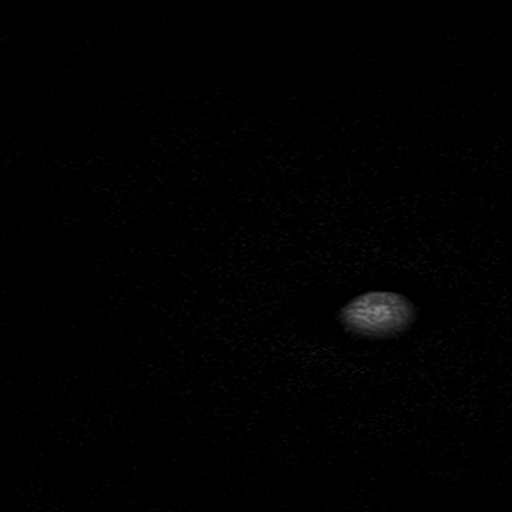
[im 22/44]
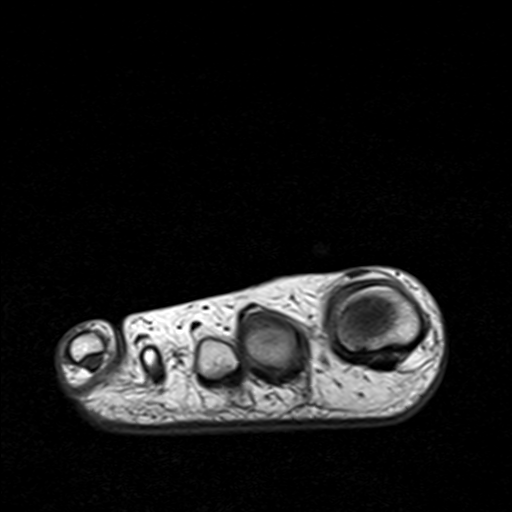
[im 39/44]
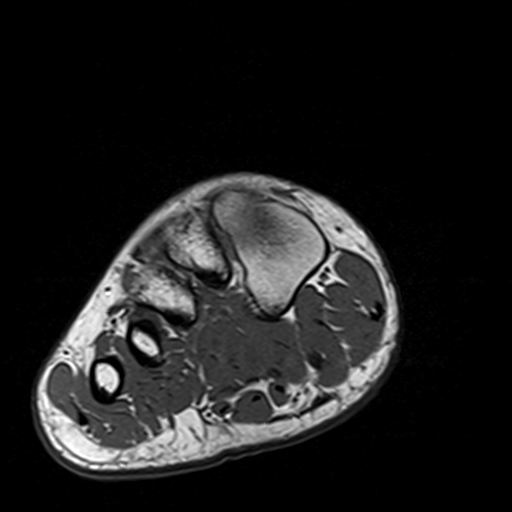

[Series 10: T2 fat-sat · coronal · 3.0mm · 0.23mm/px · 11 of 44 slices shown (1 of 2)]
[im 1/44]
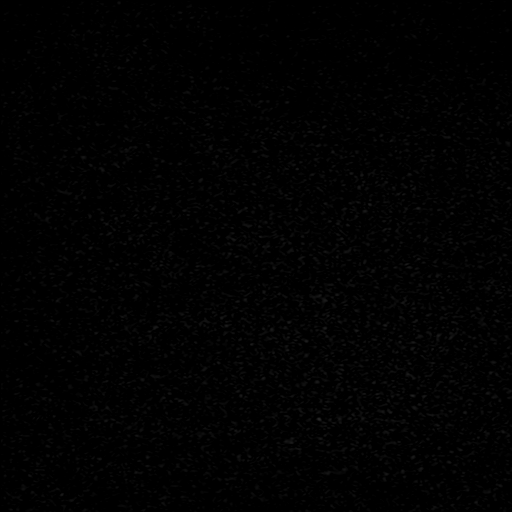
[im 5/44]
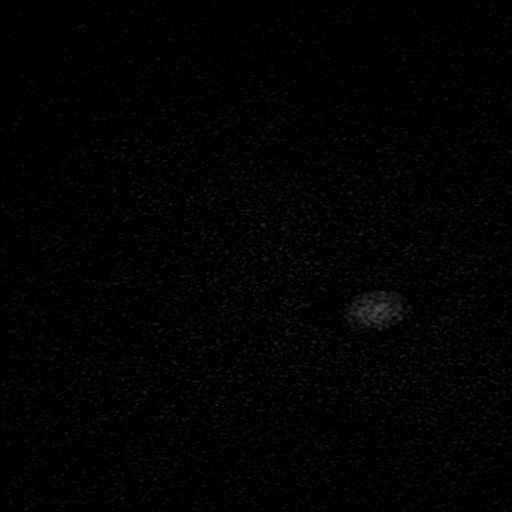
[im 9/44]
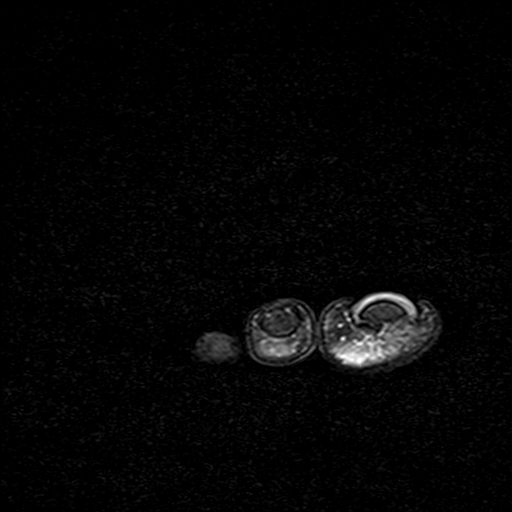
[im 13/44]
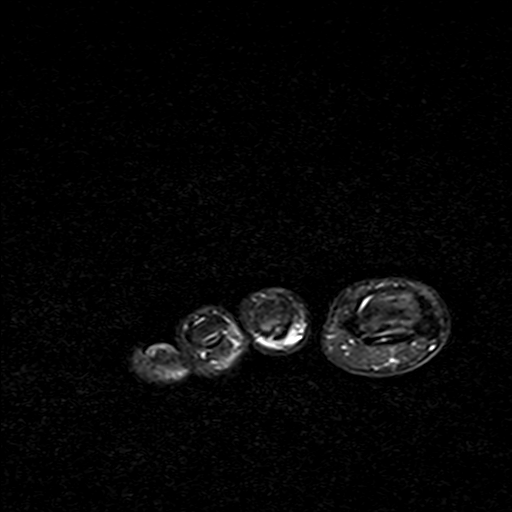
[im 18/44]
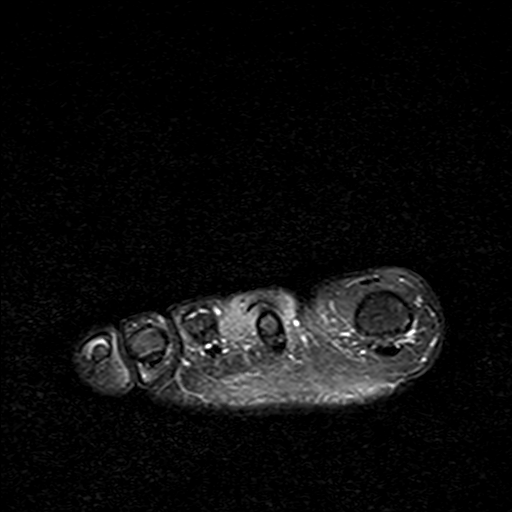
[im 22/44]
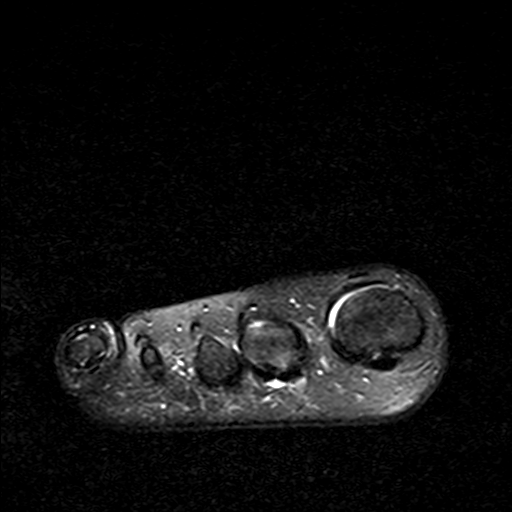
[im 26/44]
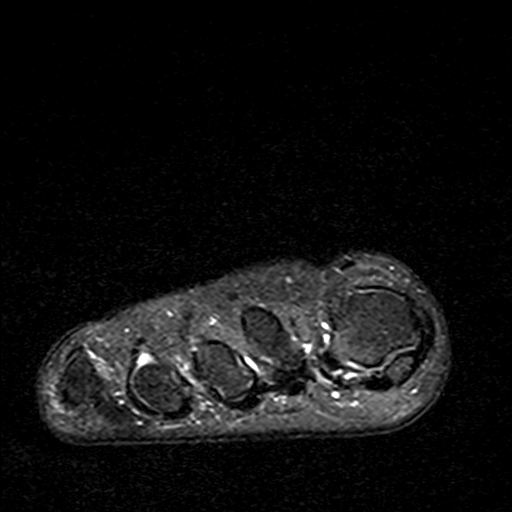
[im 31/44]
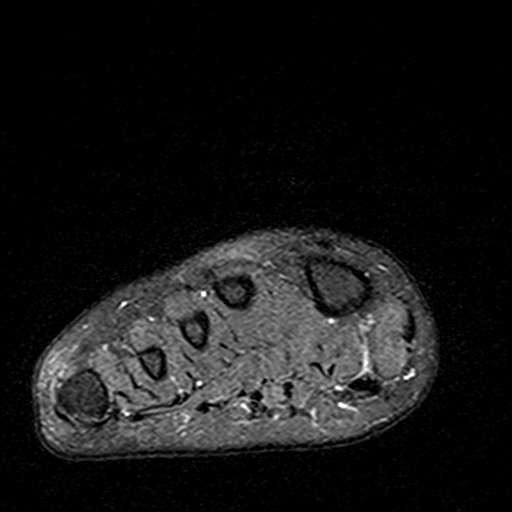
[im 35/44]
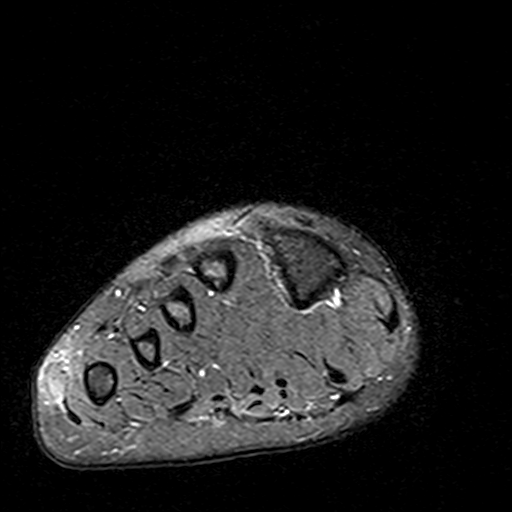
[im 39/44]
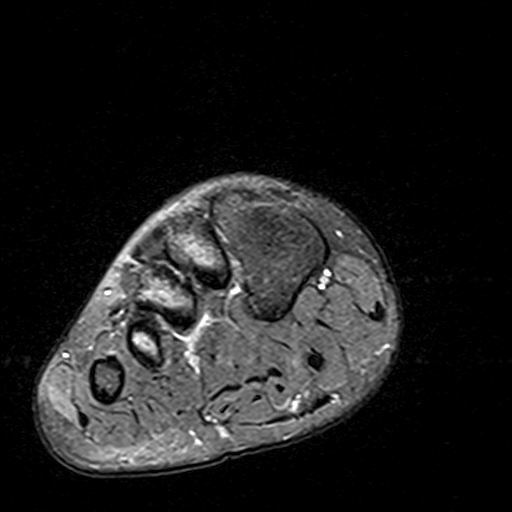
[im 44/44]
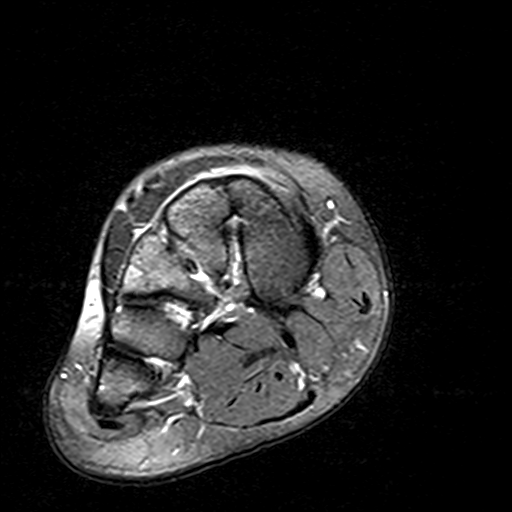

[Series 11: T2 fat-sat · axial · 3.0mm · 0.35mm/px · z∈[-66,+14]mm · 4 of 22 slices shown (2 of 2)]
[im 1/22]
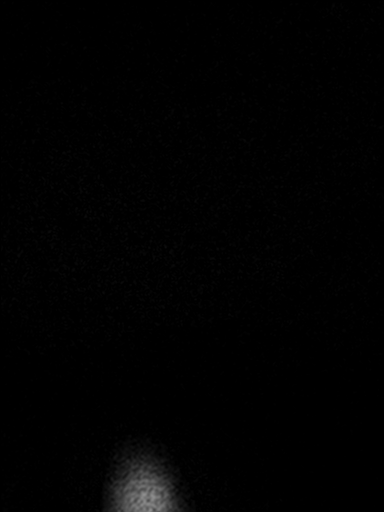
[im 6/22]
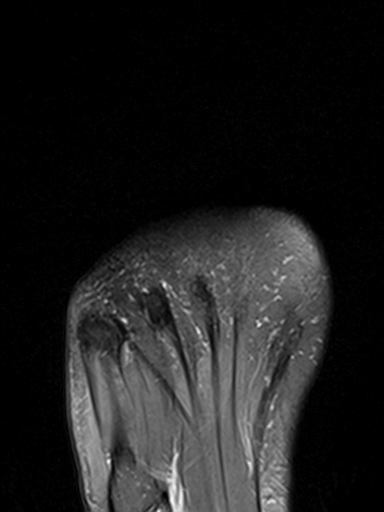
[im 11/22]
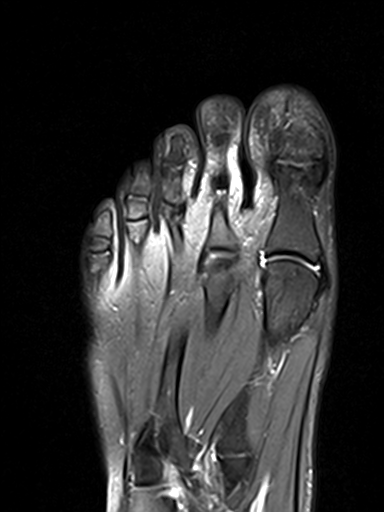
[im 22/22]
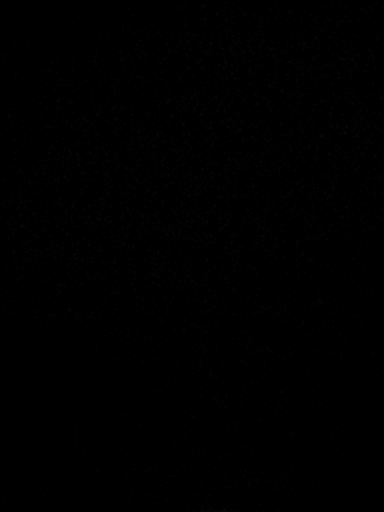

[Series 12: T1 · axial · 3.0mm · 0.35mm/px · z∈[-66,+14]mm · 3 of 22 slices shown (2 of 2)]
[im 1/22]
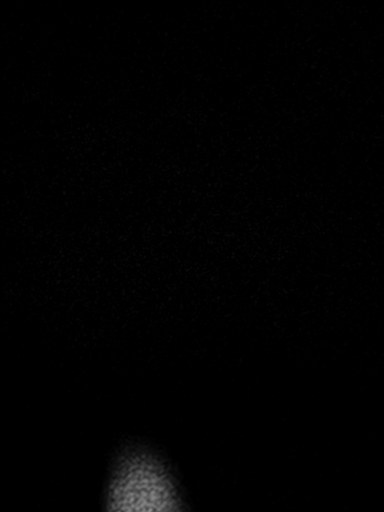
[im 11/22]
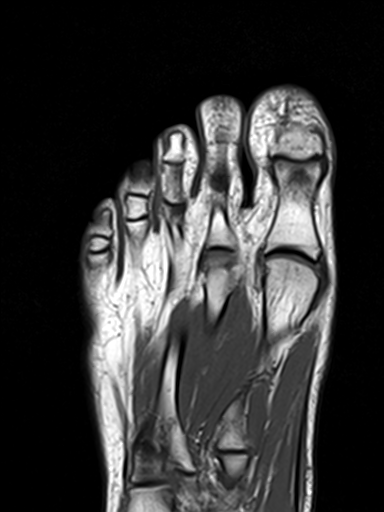
[im 22/22]
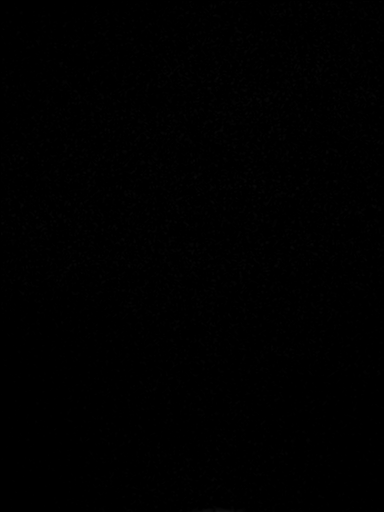

[21 of 40 positions shown; findings below may reference images not displayed]

FINDINGS: Bones/Joint/Cartilage

Chronic appearing deformity of the lateral hallux sesamoid with a
fluid-filled cleft, consistent with fracture. Decreased T1 marrow
signal within the lateral hallux sesamoid without significant marrow
edema. No dislocation. No focal bone lesion. No joint effusion.

Ligaments

Thinned and attenuated lateral collateral ligament of the first MTP
joint. Remaining collateral ligaments are intact. The plantar plate
is intact.

Muscles and Tendons
Flexor and extensor tendons are intact. No muscle edema or atrophy.

Soft tissue
No fluid collection or hematoma.  No soft tissue mass.
IMPRESSION: 1. Chronic nonunited fracture of the lateral hallux sesamoid.
2. Old injury to the first MTP joint lateral collateral ligament.
Intact plantar plate.

## 2021-02-28 DIAGNOSIS — R0981 Nasal congestion: Secondary | ICD-10-CM | POA: Diagnosis not present

## 2021-02-28 DIAGNOSIS — R051 Acute cough: Secondary | ICD-10-CM | POA: Diagnosis not present

## 2021-02-28 DIAGNOSIS — B349 Viral infection, unspecified: Secondary | ICD-10-CM | POA: Diagnosis not present

## 2021-03-13 ENCOUNTER — Other Ambulatory Visit: Payer: Self-pay | Admitting: Nurse Practitioner

## 2021-03-13 DIAGNOSIS — J4530 Mild persistent asthma, uncomplicated: Secondary | ICD-10-CM

## 2021-03-13 DIAGNOSIS — K219 Gastro-esophageal reflux disease without esophagitis: Secondary | ICD-10-CM

## 2021-07-10 ENCOUNTER — Other Ambulatory Visit: Payer: Self-pay | Admitting: Nurse Practitioner

## 2021-07-10 ENCOUNTER — Other Ambulatory Visit: Payer: Self-pay | Admitting: Family Medicine

## 2021-07-10 ENCOUNTER — Telehealth: Payer: Self-pay

## 2021-07-10 DIAGNOSIS — J4530 Mild persistent asthma, uncomplicated: Secondary | ICD-10-CM

## 2021-07-10 MED ORDER — ALBUTEROL SULFATE HFA 108 (90 BASE) MCG/ACT IN AERS: 2.0000 | INHALATION_SPRAY | Freq: Four times a day (QID) | RESPIRATORY_TRACT | 11 refills | Status: AC | PRN

## 2021-07-31 DIAGNOSIS — R110 Nausea: Secondary | ICD-10-CM | POA: Diagnosis not present

## 2021-07-31 DIAGNOSIS — R197 Diarrhea, unspecified: Secondary | ICD-10-CM | POA: Diagnosis not present

## 2021-08-01 DIAGNOSIS — R197 Diarrhea, unspecified: Secondary | ICD-10-CM | POA: Diagnosis not present

## 2021-08-01 DIAGNOSIS — R109 Unspecified abdominal pain: Secondary | ICD-10-CM | POA: Diagnosis not present

## 2021-08-01 DIAGNOSIS — A045 Campylobacter enteritis: Secondary | ICD-10-CM | POA: Diagnosis not present

## 2021-08-01 DIAGNOSIS — E871 Hypo-osmolality and hyponatremia: Secondary | ICD-10-CM | POA: Diagnosis not present

## 2021-08-01 DIAGNOSIS — E876 Hypokalemia: Secondary | ICD-10-CM | POA: Diagnosis not present

## 2021-08-22 DIAGNOSIS — J4540 Moderate persistent asthma, uncomplicated: Secondary | ICD-10-CM | POA: Diagnosis not present

## 2021-08-22 DIAGNOSIS — Z0000 Encounter for general adult medical examination without abnormal findings: Secondary | ICD-10-CM | POA: Diagnosis not present

## 2021-08-22 DIAGNOSIS — K219 Gastro-esophageal reflux disease without esophagitis: Secondary | ICD-10-CM | POA: Diagnosis not present

## 2021-08-22 DIAGNOSIS — Z1331 Encounter for screening for depression: Secondary | ICD-10-CM | POA: Diagnosis not present

## 2021-09-23 IMAGING — CR DG CHEST 2V
2 series · 2 of 2 positions shown · non-contrast
Comparison: None

CLINICAL DATA: Pneumonia, follow-up, chest tightness, RIGHT-side
chest pain and cough for 2 months, history asthma

EXAM:
CHEST - 2 VIEW

[w chest pa]
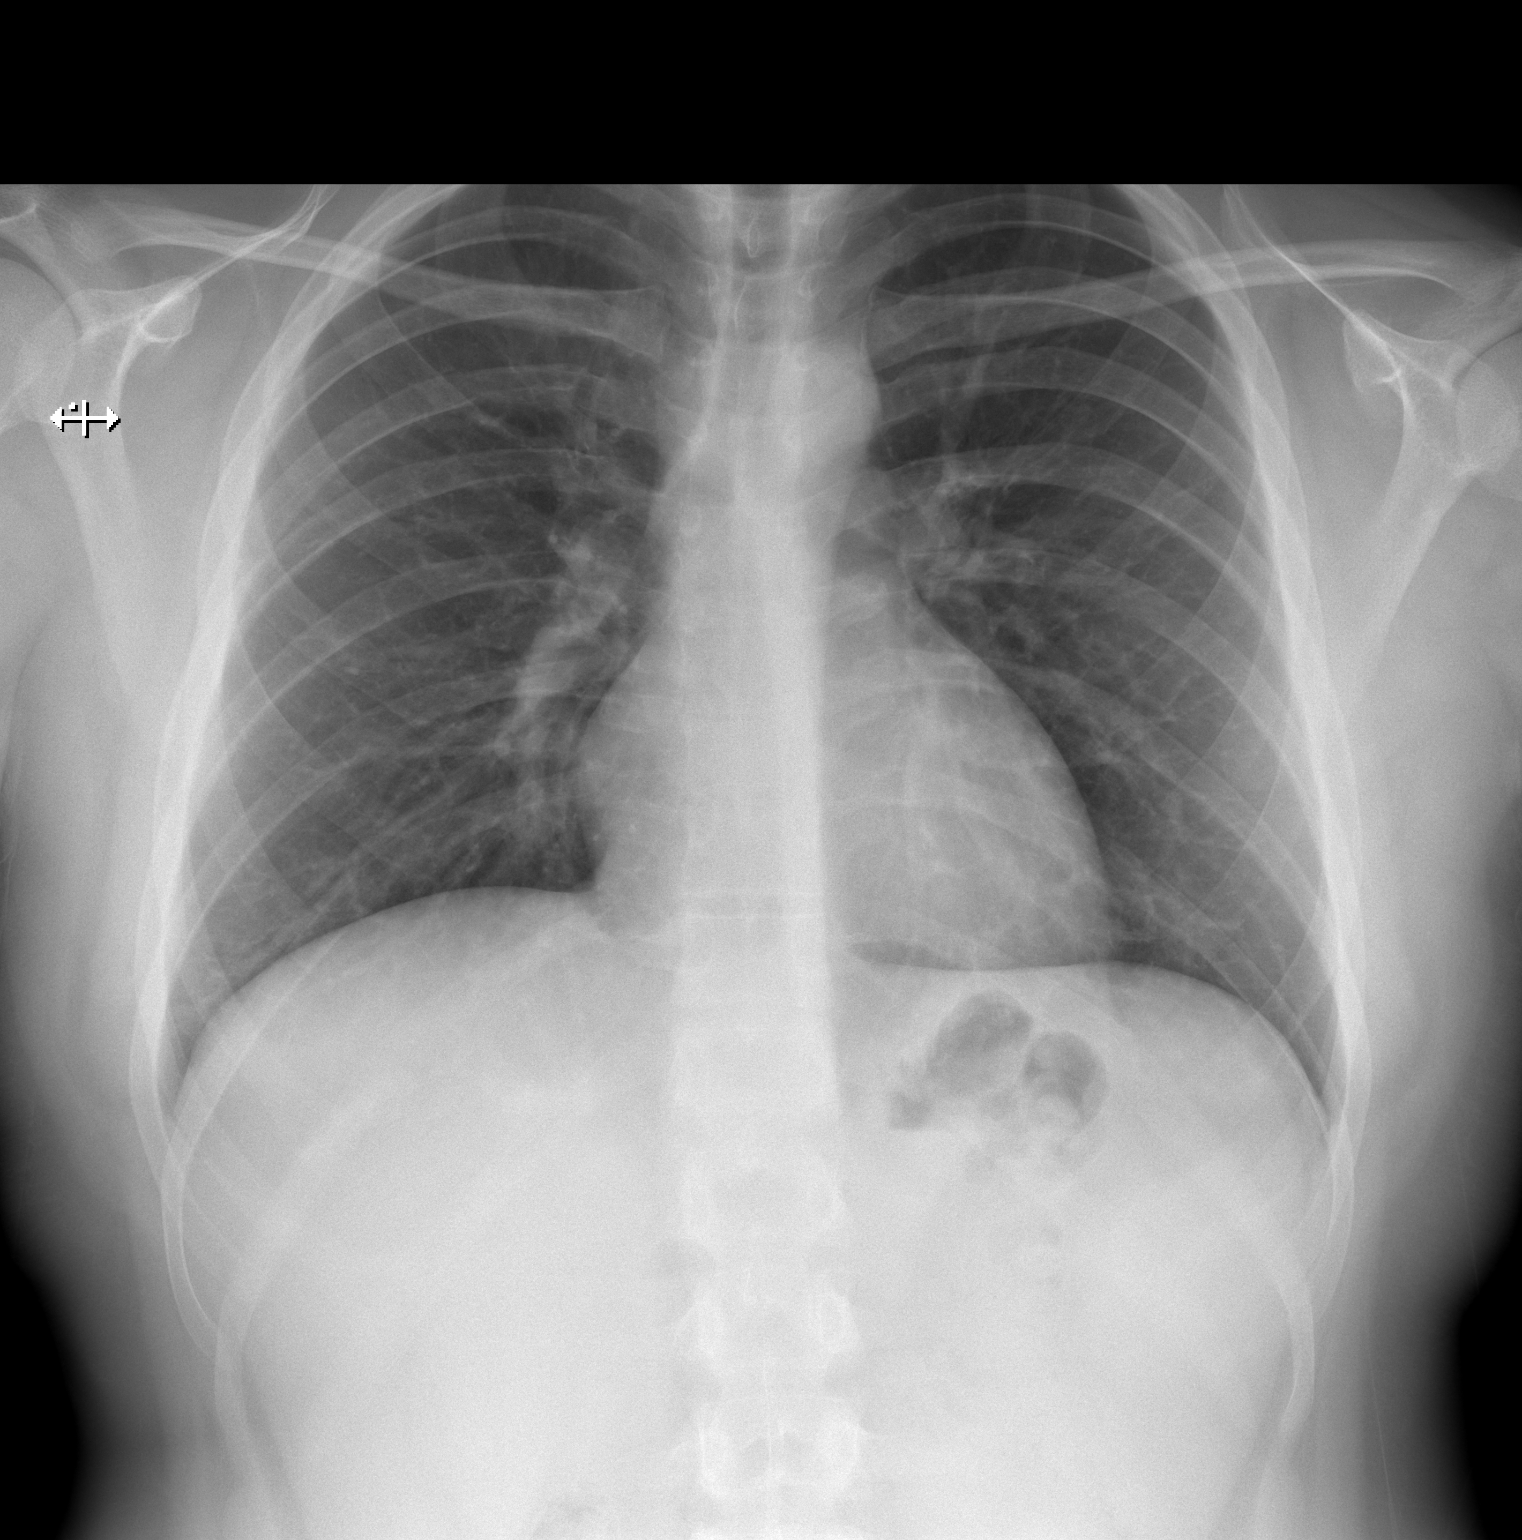

[w chest lat]
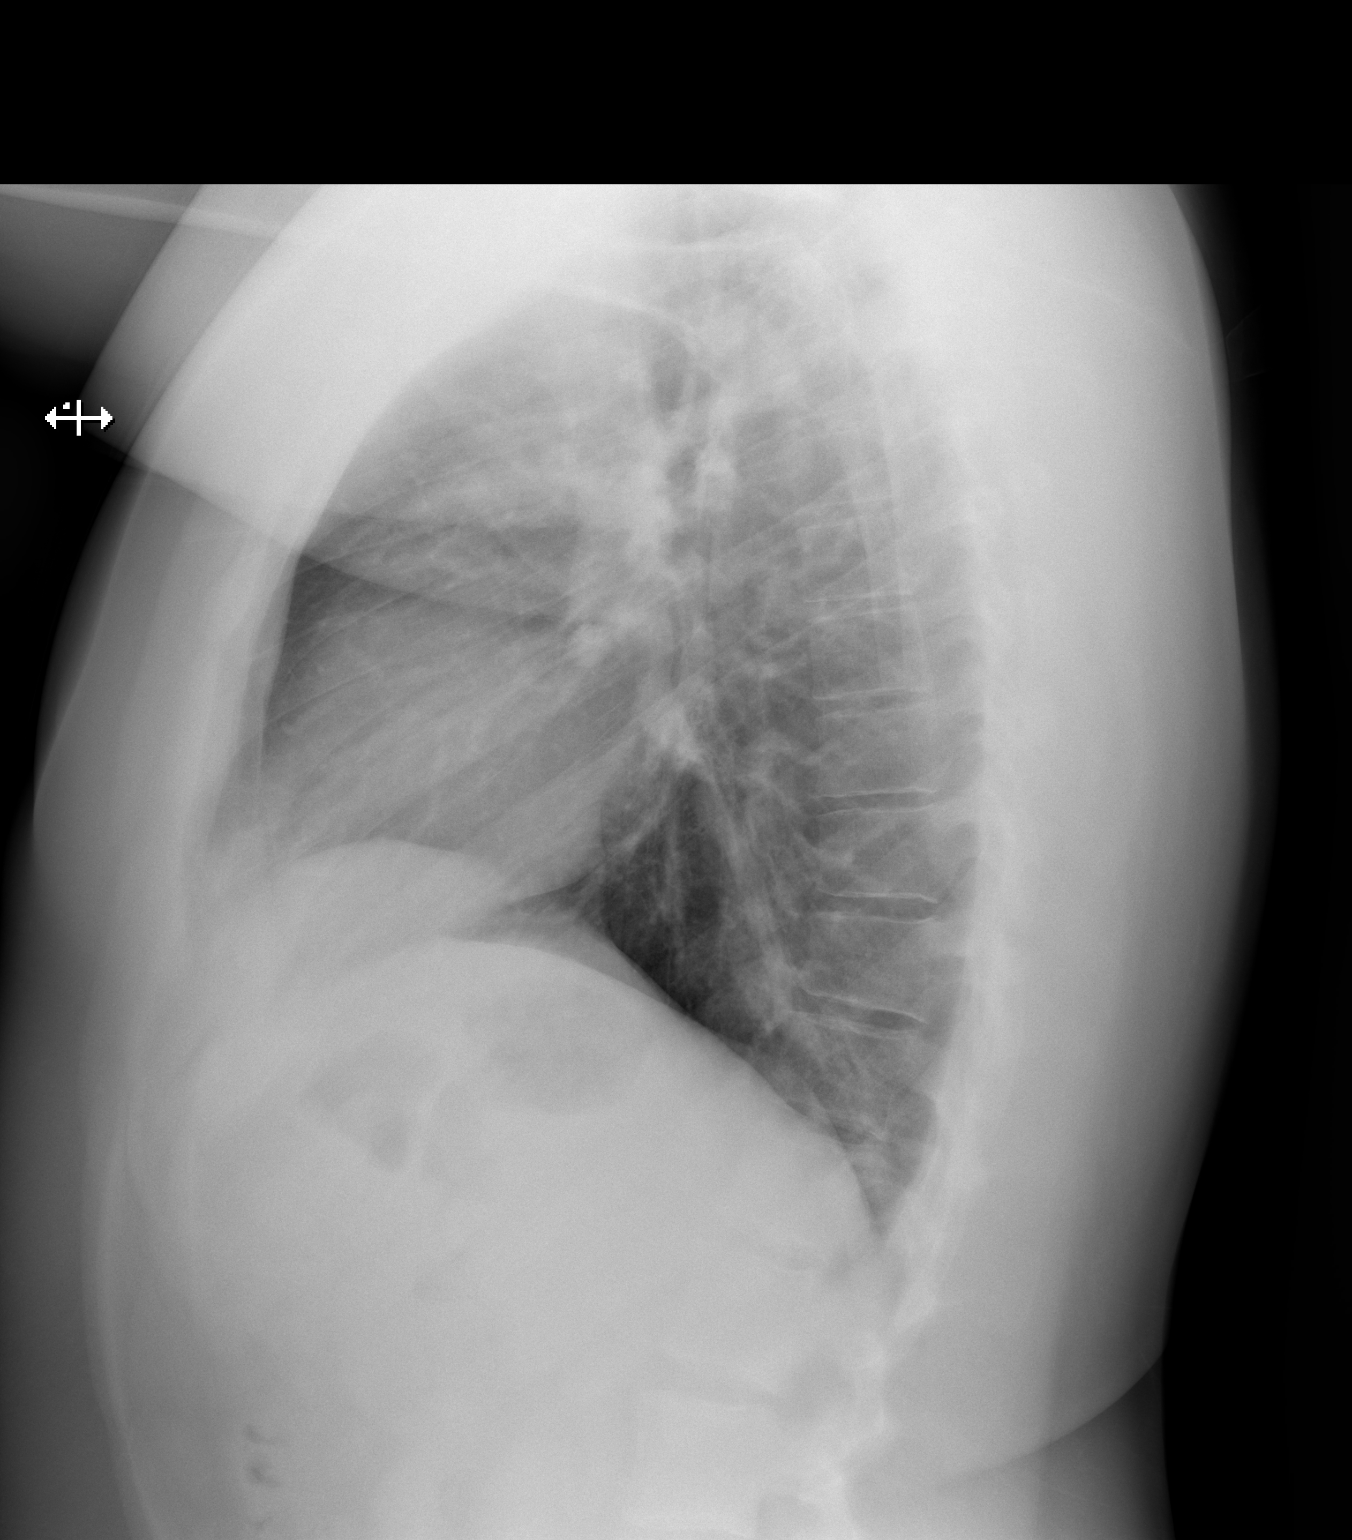

[2 of 2 positions shown; findings below may reference images not displayed]

FINDINGS: Normal heart size, mediastinal contours, and pulmonary vascularity.

Lungs clear.

No pleural effusion or pneumothorax.

Bones unremarkable.
IMPRESSION: Normal exam.

## 2021-12-27 DIAGNOSIS — B349 Viral infection, unspecified: Secondary | ICD-10-CM | POA: Diagnosis not present

## 2021-12-27 DIAGNOSIS — R0982 Postnasal drip: Secondary | ICD-10-CM | POA: Diagnosis not present

## 2022-01-19 ENCOUNTER — Ambulatory Visit: Payer: BC Managed Care – PPO | Admitting: Family Medicine

## 2022-01-19 ENCOUNTER — Encounter: Payer: Self-pay | Admitting: Family Medicine

## 2022-01-19 VITALS — BP 110/70 | HR 90 | Temp 98.2°F | Resp 16 | Ht 72.6 in | Wt 210.2 lb

## 2022-01-19 DIAGNOSIS — K219 Gastro-esophageal reflux disease without esophagitis: Secondary | ICD-10-CM | POA: Diagnosis not present

## 2022-01-19 DIAGNOSIS — J4541 Moderate persistent asthma with (acute) exacerbation: Secondary | ICD-10-CM | POA: Diagnosis not present

## 2022-01-19 MED ORDER — PREDNISONE 20 MG PO TABS: ORAL_TABLET | ORAL | 0 refills | Status: DC

## 2022-01-19 NOTE — Progress Notes (Signed)
Patient ID: Brandon Bishop, male    DOB: 2000/03/21, 22 y.o.   MRN: [id number]  This visit was conducted in person.  BP 110/70   Pulse 90   Temp 98.2 F (36.8 C)   Resp 16   Ht 6' 0.6" (1.844 m)   Wt 210 lb 4 oz (95.4 kg)   SpO2 97%   BMI 28.05 kg/m    CC:  Chief Complaint  Patient presents with   Establish Care   Cough    Prolonged cough X 1 month  coughing so hard that makes him throw up.   Fever    Subjective:   HPI: Brandon Bishop is a 22 y.o. male presenting on 01/19/2022 for Establish Care, Cough (Prolonged cough X 1 month  coughing so hard that makes him throw up.), and Fever  Previous PCP:  Saw  Noemi Chapel NP at [facility].  She is no longer at that practice.  Last CPX 2 years ago but did have CPX 08/2021 but did  this was to establish.  Moderate persistent asthma: per pt several years ago started to get treated as asthma.. no PFTs done. Started after a COVID infection in 2020. Intermittent issues.   He has been taking flovent at night daily...  previously moderate controlled with Singulair 10 milligrams daily, Xyzal daily, Flonase or Nasacort and albuterol as needed.  In last week  feeling worse.. productive to dry cough, fever 4 days ago 100.41F   SE to Arnuity inhaler in past... made asthma symptoms worse   Post tussive emesis from coughing fits.  Head congestion.   In last 24 hours fever has resolved.  No body aches. Bilateral ear pain, chest tightness.  He is wheezing and SOB.   No sick contacts.. was around [place] who was around uncle with flu... indirect contact.   Has tried mucinex.  Neg COVID trest yesterday.  GERD: Well-controlled on pantoprazole 40 mg  as needed  Wt Readings from Last 3 Encounters:  01/19/22 210 lb 4 oz (95.4 kg)  08/24/20 211 lb 12.8 oz (96.1 kg) (95 %, Z= 1.68)*  05/23/20 228 lb 12.8 oz (103.8 kg) (98 %, Z= 2.05)*   * Growth percentiles are based on CDC (Boys, 2-20 Years) data.   Body mass index is 28.05  kg/m.       Relevant past medical, surgical, family and social history reviewed and updated as indicated. Interim medical history since our last visit reviewed. Allergies and medications reviewed and updated. Outpatient Medications Prior to Visit  Medication Sig Dispense Refill   albuterol (PROVENTIL) (2.5 MG/3ML) 0.083% nebulizer solution Take 3 mLs (2.5 mg total) by nebulization 4 (four) times daily. 75 mL 2   albuterol (VENTOLIN HFA) 108 (90 Base) MCG/ACT inhaler Inhale 2 puffs into the lungs every 6 (six) hours as needed for wheezing or shortness of breath. 8 g 11   FLOVENT HFA 220 MCG/ACT inhaler INHALE 2 PUFFS INTO THE LUNGS TWO TIMES A DAY 12 g 1   fluticasone (FLONASE) 50 MCG/ACT nasal spray Place 2 sprays into both nostrils daily.     levocetirizine (XYZAL) 5 MG tablet Take 1 tablet (5 mg total) by mouth every evening. 90 tablet 1   montelukast (SINGULAIR) 10 MG tablet TAKE ONE TABLET BY MOUTH DAILY 90 tablet 1   pantoprazole (PROTONIX) 40 MG tablet TAKE ONE TABLET BY MOUTH DAILY 90 tablet 1   triamcinolone (NASACORT) 55 MCG/ACT AERO nasal inhaler Place 2 sprays into the nose daily. 16.9  mL 3   No facility-administered medications prior to visit.     Per HPI unless specifically indicated in ROS section below Review of Systems  Constitutional:  Negative for fatigue and fever.  HENT:  Negative for ear pain.   Eyes:  Negative for pain.  Respiratory:  Negative for cough and shortness of breath.   Cardiovascular:  Negative for chest pain, palpitations and leg swelling.  Gastrointestinal:  Negative for abdominal pain.  Genitourinary:  Negative for dysuria.  Musculoskeletal:  Negative for arthralgias.  Neurological:  Negative for syncope, light-headedness and headaches.  Psychiatric/Behavioral:  Negative for dysphoric mood.    Objective:  BP 110/70   Pulse 90   Temp 98.2 F (36.8 C)   Resp 16   Ht 6' 0.6" (1.844 m)   Wt 210 lb 4 oz (95.4 kg)   SpO2 97%   BMI 28.05 kg/m    Wt Readings from Last 3 Encounters:  01/19/22 210 lb 4 oz (95.4 kg)  08/24/20 211 lb 12.8 oz (96.1 kg) (95 %, Z= 1.68)*  05/23/20 228 lb 12.8 oz (103.8 kg) (98 %, Z= 2.05)*   * Growth percentiles are based on CDC (Boys, 2-20 Years) data.      Physical Exam Constitutional:      General: He is not in acute distress.    Appearance: Normal appearance. He is well-developed. He is not ill-appearing or toxic-appearing.  HENT:     Head: Normocephalic and atraumatic.     Right Ear: Hearing, tympanic membrane, ear canal and external ear normal. No tenderness. No foreign body. Tympanic membrane is not retracted or bulging.     Left Ear: Hearing, tympanic membrane, ear canal and external ear normal. No tenderness. No foreign body. Tympanic membrane is not retracted or bulging.     Nose: Nose normal. No mucosal edema or rhinorrhea.     Right Sinus: No maxillary sinus tenderness or frontal sinus tenderness.     Left Sinus: No maxillary sinus tenderness or frontal sinus tenderness.     Mouth/Throat:     Dentition: Normal dentition. No dental caries.     Pharynx: Uvula midline. No oropharyngeal exudate.     Tonsils: No tonsillar abscesses.  Eyes:     General: Lids are normal. Lids are everted, no foreign bodies appreciated.     Conjunctiva/sclera: Conjunctivae normal.     Pupils: Pupils are equal, round, and reactive to light.  Neck:     Thyroid: No thyroid mass or thyromegaly.     Vascular: No carotid bruit.     Trachea: Trachea and phonation normal.  Cardiovascular:     Rate and Rhythm: Normal rate and regular rhythm.     Pulses: Normal pulses.     Heart sounds: Normal heart sounds, S1 normal and S2 normal. No murmur heard.    No gallop.  Pulmonary:     Effort: Pulmonary effort is normal. No respiratory distress.     Breath sounds: Normal breath sounds. No wheezing, rhonchi or rales.  Abdominal:     General: Bowel sounds are normal.     Palpations: Abdomen is soft.     Tenderness: There  is no abdominal tenderness. There is no guarding or rebound.     Hernia: No hernia is present.  Musculoskeletal:     Cervical back: Normal range of motion and neck supple.  Skin:    General: Skin is warm and dry.     Findings: No rash.  Neurological:     Mental  Status: He is alert.     Deep Tendon Reflexes: Reflexes are normal and symmetric.  Psychiatric:        Speech: Speech normal.        Behavior: Behavior normal.        Judgment: Judgment normal.       Results for orders placed or performed in visit on 05/23/20  STREP GROUP A AG, W/REFLEX TO CULT   Specimen: Throat  Result Value Ref Range   Streptococcus Group A AG NOT DETECTED NOT DETECTED  Culture, Group A Strep  Result Value Ref Range   MICRO NUMBER: [id number]    SPECIMEN QUALITY: Adequate    SOURCE: THROAT    STATUS: FINAL    RESULT: No group A Streptococcus isolated   SARS-CoV-2 RNA (COVID-19) and Respiratory Viral Panel, Qualitative NAAT  Result Value Ref Range   SARS CoV2 RNA Not Detected Not Detected   Adenovirus B Not Detected Not Detected   Rhinovirus Not Detected Not Detected   Influenza A Not Detected Not Detected   INFLUENZA A SUBTYPE H1 Not Detected Not Detected   INFLUENZA A SUBTYPE H3 Not Detected Not Detected   Influenza B Not Detected Not Detected   Metapneumovirus Not Detected Not Detected   Respiratory Syncytial Virus A Not Detected Not Detected   Respiratory Syncytial Virus B Not Detected Not Detected   HUMAN PARAINFLU VIRUS 1 Not Detected Not Detected   HUMAN PARAINFLU VIRUS 2 Not Detected Not Detected   HUMAN PARAINFLU VIRUS 3 Not Detected Not Detected   Comment see note      COVID 19 screen:  No recent travel or known exposure to COVID19 The patient denies respiratory symptoms of COVID 19 at this time. The importance of social distancing was discussed today.   Assessment and Plan Moderate persistent asthma with acute exacerbation Assessment & Plan: Likely acute viral upper respiratory  infection with asthma exacerbation.  Treat with symptomatic care and prednisone taper.  Use albuterol as needed 2 puffs every 4-6 hours for SOB/wheeze/coughing fit Will move forward with pulmonary referral for definitive  diagnosis of asthma with lung function tests.  Continue flovent for now, but if not improving  and pulmonary referral wait long.. will consider changing to alternate inhaler.  Orders: -     Ambulatory referral to Pulmonology  Moderate persistent asthma with exacerbation Assessment & Plan: Likely acute viral upper respiratory infection with asthma exacerbation.  Treat with symptomatic care and prednisone taper.  Use albuterol as needed 2 puffs every 4-6 hours for SOB/wheeze/coughing fit Will move forward with pulmonary referral for definitive  diagnosis of asthma with lung function tests.  Continue flovent for now, but if not improving  and pulmonary referral wait long.. will consider changing to alternate inhaler.   Gastroesophageal reflux disease, unspecified whether esophagitis present Assessment & Plan: Stable, chronic.  Continue current medication.  Well-controlled on pantoprazole 40 mg  as needed    Other orders -     predniSONE; 3 tabs by mouth daily x 3 days, then 2 tabs by mouth daily x 2 days then 1 tab by mouth daily x 2 days  Dispense: 15 tablet; Refill: 0       Eliezer Lofts, MD

## 2022-01-19 NOTE — Patient Instructions (Signed)
Likely acute viral upper respiratory infection with asthma exacerbation.  Treat with symptomatic care and prednisone taper.  Use albuterol as needed 2 puffs every 4-6 hours for SOB/wheeze/coughing fit Will move forward with pulmonary referral for definitive  diagnosis of asthma with lung function tests.  Continue flovent for now, but if not improving  and pulmonary referral wait long.. will consider changing to alternate inhaler. 

## 2022-01-26 ENCOUNTER — Encounter: Payer: Self-pay | Admitting: Family Medicine

## 2022-02-08 NOTE — Assessment & Plan Note (Signed)
Stable, chronic.  Continue current medication.  Well-controlled on pantoprazole 40 mg  as needed

## 2022-02-08 NOTE — Assessment & Plan Note (Signed)
Likely acute viral upper respiratory infection with asthma exacerbation.  Treat with symptomatic care and prednisone taper.  Use albuterol as needed 2 puffs every 4-6 hours for SOB/wheeze/coughing fit Will move forward with pulmonary referral for definitive  diagnosis of asthma with lung function tests.  Continue flovent for now, but if not improving  and pulmonary referral wait long.. will consider changing to alternate inhaler.

## 2022-02-23 ENCOUNTER — Encounter: Payer: Self-pay | Admitting: Pulmonary Disease

## 2022-02-23 ENCOUNTER — Ambulatory Visit: Payer: BC Managed Care – PPO | Admitting: Pulmonary Disease

## 2022-02-23 VITALS — BP 108/64 | HR 87 | Temp 98.3°F | Ht 72.0 in | Wt 216.4 lb

## 2022-02-23 DIAGNOSIS — J4530 Mild persistent asthma, uncomplicated: Secondary | ICD-10-CM | POA: Diagnosis not present

## 2022-02-23 DIAGNOSIS — J45909 Unspecified asthma, uncomplicated: Secondary | ICD-10-CM

## 2022-02-23 MED ORDER — MONTELUKAST SODIUM 10 MG PO TABS: 10.0000 mg | ORAL_TABLET | Freq: Every day | ORAL | 1 refills | Status: DC

## 2022-02-23 MED ORDER — FLOVENT HFA 220 MCG/ACT IN AERO: INHALATION_SPRAY | RESPIRATORY_TRACT | 5 refills | Status: DC

## 2022-02-23 NOTE — Addendum Note (Signed)
Addended by: Elton Sin on: 02/23/2022 04:56 PM   Modules accepted: Orders

## 2022-02-23 NOTE — Patient Instructions (Signed)
Continue the Flovent and Singulair.  Will continue these medications as your symptoms are under control Check CBC differential, IgE Schedule PFTs in 3 months and return to clinic in 3 months

## 2022-02-23 NOTE — Progress Notes (Signed)
Brandon Bishop    [id number]    06-20-2000  Primary Care Physician:Bedsole, Mervyn Gay, MD  Referring Physician: Jinny Sanders, MD [address] [place],  [place] [id number]  Chief complaint: Consult for asthma  HPI: 22 y.o. who  has a past medical history of Allergy, Asthma, Pneumonia of both lungs due to infectious organism (05/23/2020), and Sore throat (05/23/2020).   Diagnosed with asthma around 2020.  Maintained on Flovent and Singulair inhaler with albuterol as needed.  His symptoms are generally under good control with occasional wheezing and coughing.  Seen by primary care with acute exacerbation in December and January with increased dyspnea, wheezing.  Treated with prednisone.  Referred to PCCM for further evaluation  He had been evaluated by allergist in the past and was told that he is sensitive to feathers, grass, pollen, mold and dust  Pets: No pets Occupation: Art therapist Exposures: Had mold exposure from the basement around 2022.  No ongoing exposures.  No hot tubs Jacuzzis, feather pillows or comforters Smoking history: Never smoker.  No vaping Travel history: No significant travel history Relevant family history: No family history of lung disease Outpatient Encounter Medications as of 02/23/2022  Medication Sig   albuterol (VENTOLIN HFA) 108 (90 Base) MCG/ACT inhaler Inhale 2 puffs into the lungs every 6 (six) hours as needed for wheezing or shortness of breath.   FLOVENT HFA 220 MCG/ACT inhaler INHALE 2 PUFFS INTO THE LUNGS TWO TIMES A DAY   fluticasone (FLONASE) 50 MCG/ACT nasal spray Place 2 sprays into both nostrils daily.   levocetirizine (XYZAL) 5 MG tablet Take 1 tablet (5 mg total) by mouth every evening.   montelukast (SINGULAIR) 10 MG tablet TAKE ONE TABLET BY MOUTH DAILY   pantoprazole (PROTONIX) 40 MG tablet TAKE ONE TABLET BY MOUTH DAILY   [DISCONTINUED] predniSONE (DELTASONE) 20 MG tablet 3 tabs by mouth daily x 3 days, then 2 tabs by mouth  daily x 2 days then 1 tab by mouth daily x 2 days   [DISCONTINUED] triamcinolone (NASACORT) 55 MCG/ACT AERO nasal inhaler Place 2 sprays into the nose daily.   No facility-administered encounter medications on file as of 02/23/2022.    Allergies as of 02/23/2022 - Review Complete 01/19/2022  Allergen Reaction Noted   Other  01/21/2020   Dust mite extract  01/22/2020   Grass pollen([id number]-swt vern)  01/22/2020   Mixed feathers  01/22/2020   Molds & smuts  01/22/2020    Past Medical History:  Diagnosis Date   Allergy    Phreesia 01/21/2020   Asthma    Phreesia 01/21/2020   Pneumonia of both lungs due to infectious organism 05/23/2020   Sore throat 05/23/2020    Past Surgical History:  Procedure Laterality Date   FRACTURE SURGERY N/A    sesmoid bone   WISDOM TOOTH EXTRACTION  2018   all 4 removed    Family History  Problem Relation Age of Onset   Hypertension Maternal Grandmother    Diabetes Mellitus II Paternal Grandfather     Social History   Socioeconomic History   Marital status: Significant Other    Spouse name: Not on file   Number of children: Not on file   Years of education: Not on file   Highest education level: Not on file  Occupational History   Not on file  Tobacco Use   Smoking status: Never    Passive exposure: Never   Smokeless tobacco: Never  Substance and Sexual Activity   Alcohol use: Never   Drug use: Never   Sexual activity: Not Currently  Other Topics Concern   Not on file  Social History Narrative    In school for [employer]... 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    Has girlfriend.    Plays golf for fun, reading.       Diet: moderate   Exercise: off and on         Social Determinants of Health   Financial Resource Strain: Not on file  Food Insecurity: Not on file  Transportation Needs: Not on file  Physical Activity: Not on file  Stress: Not on file  Social Connections: Not on file  Intimate Partner Violence: Not on  file    Review of systems: Review of Systems  Constitutional: Negative for fever and chills.  HENT: Negative.   Eyes: Negative for blurred vision.  Respiratory: as per HPI  Cardiovascular: Negative for chest pain and palpitations.  Gastrointestinal: Negative for vomiting, diarrhea, blood per rectum. Genitourinary: Negative for dysuria, urgency, frequency and hematuria.  Musculoskeletal: Negative for myalgias, back pain and joint pain.  Skin: Negative for itching and rash.  Neurological: Negative for dizziness, tremors, focal weakness, seizures and loss of consciousness.  Endo/Heme/Allergies: Negative for environmental allergies.  Psychiatric/Behavioral: Negative for depression, suicidal ideas and hallucinations.  All other systems reviewed and are negative.  Physical Exam: Blood pressure 108/64, pulse 87, temperature 98.3 F (36.8 C), temperature source Oral, height 6' (1.829 m), weight 216 lb 6.4 oz (98.2 kg), SpO2 97 %. Gen:      No acute distress HEENT:  EOMI, sclera anicteric Neck:     No masses; no thyromegaly Lungs:    Clear to auscultation bilaterally; normal respiratory effort CV:         Regular rate and rhythm; no murmurs Abd:      + bowel sounds; soft, non-tender; no palpable masses, no distension Ext:    No edema; adequate peripheral perfusion Skin:      Warm and dry; no rash Neuro: alert and oriented x 3 Psych: normal mood and affect  Data Reviewed: Imaging: Chest x-ray 08/15/2020-no active cardiopulmonary disease I have reviewed the images personally.  PFTs:  Labs:  Assessment:  Moderate persistent asthma Symptoms are well-controlled on Flovent and Singulair and will continue for now Check CBC differential, IgE Schedule PFTs  Plan/Recommendations: Flovent, Singulair CBC, IgE PFTs  Marshell Garfinkel MD [facility] 02/23/2022, 3:13 PM  CC: Jinny Sanders, MD

## 2022-02-26 LAB — CBC WITH DIFFERENTIAL/PLATELET
Absolute Monocytes: 793 cells/uL (ref 200–950)
Basophils Absolute: 21 cells/uL (ref 0–200)
Basophils Relative: 0.2 %
Eosinophils Absolute: 155 cells/uL (ref 15–500)
Eosinophils Relative: 1.5 %
HCT: 46.9 % (ref 38.5–50.0)
Hemoglobin: 16 g/dL (ref 13.2–17.1)
Lymphs Abs: 2905 cells/uL (ref 850–3900)
MCH: 28.6 pg (ref 27.0–33.0)
MCHC: 34.1 g/dL (ref 32.0–36.0)
MCV: 83.9 fL (ref 80.0–100.0)
MPV: 9.6 fL (ref 7.5–12.5)
Monocytes Relative: 7.7 %
Neutro Abs: 6427 cells/uL (ref 1500–7800)
Neutrophils Relative %: 62.4 %
Platelets: 296 10*3/uL (ref 140–400)
RBC: 5.59 10*6/uL (ref 4.20–5.80)
RDW: 13.3 % (ref 11.0–15.0)
Total Lymphocyte: 28.2 %
WBC: 10.3 10*3/uL (ref 3.8–10.8)

## 2022-02-26 LAB — IGE: IgE (Immunoglobulin E), Serum: 9 kU/L (ref <=114)

## 2022-02-28 ENCOUNTER — Telehealth: Payer: Self-pay

## 2022-02-28 ENCOUNTER — Other Ambulatory Visit (HOSPITAL_COMMUNITY): Payer: Self-pay

## 2022-02-28 NOTE — Telephone Encounter (Signed)
PA request received via CMM for Flovent HFA 220MCG/ACT aerosol  Key: [id number]  PA not submitted due to test claim showing:

## 2022-03-01 NOTE — Telephone Encounter (Signed)
Please advise on message from pharmacy team sir.

## 2022-03-05 NOTE — Telephone Encounter (Signed)
Please send in prescription for Arnuity 100 mcg twice daily

## 2022-03-06 NOTE — Telephone Encounter (Signed)
Tried calling the pt and there was no answer and no option to leave msg.

## 2022-03-07 MED ORDER — BECLOMETHASONE DIPROP HFA 80 MCG/ACT IN AERB: 2.0000 | INHALATION_SPRAY | Freq: Two times a day (BID) | RESPIRATORY_TRACT | 2 refills | Status: DC

## 2022-03-07 NOTE — Telephone Encounter (Signed)
Please send a prescription for Qvar 160 mcg twice daily

## 2022-03-07 NOTE — Telephone Encounter (Signed)
Spoke with the pt  He states that he is unable to take arnuity- tried before and this caused sore in mouth and cough He states he has not tried qvar or asmanex  He does not want dry powder inhaler  Dr Vaughan Browner, please advise, thanks!

## 2022-03-07 NOTE — Telephone Encounter (Signed)
Spoke to pt and informed him that Qvar 80 MCG 2 puffs (160 mcg) would be sent in for him to [facility]. Pt verbalized understanding. Nothing further needed.

## 2022-07-24 ENCOUNTER — Encounter: Payer: Self-pay | Admitting: Pulmonary Disease

## 2022-07-24 ENCOUNTER — Ambulatory Visit (INDEPENDENT_AMBULATORY_CARE_PROVIDER_SITE_OTHER): Payer: BC Managed Care – PPO | Admitting: Pulmonary Disease

## 2022-07-24 ENCOUNTER — Ambulatory Visit: Payer: BC Managed Care – PPO | Admitting: Pulmonary Disease

## 2022-07-24 ENCOUNTER — Telehealth: Payer: Self-pay | Admitting: Pulmonary Disease

## 2022-07-24 VITALS — BP 112/66 | HR 84 | Temp 98.4°F | Ht 70.0 in | Wt 218.2 lb

## 2022-07-24 DIAGNOSIS — J45909 Unspecified asthma, uncomplicated: Secondary | ICD-10-CM

## 2022-07-24 LAB — PULMONARY FUNCTION TEST
DL/VA % pred: 108 %
DL/VA: 5.62 ml/min/mmHg/L
DLCO cor % pred: 112 %
DLCO cor: 36.9 ml/min/mmHg
DLCO unc % pred: 112 %
DLCO unc: 36.9 ml/min/mmHg
FEF 25-75 Post: 3.64 L/sec
FEF 25-75 Pre: 4.22 L/sec
FEF2575-%Change-Post: -13 %
FEF2575-%Pred-Post: 73 %
FEF2575-%Pred-Pre: 85 %
FEV1-%Change-Post: -4 %
FEV1-%Pred-Post: 92 %
FEV1-%Pred-Pre: 97 %
FEV1-Post: 4.32 L
FEV1-Pre: 4.54 L
FEV1FVC-%Change-Post: 3 %
FEV1FVC-%Pred-Pre: 93 %
FEV6-%Change-Post: -7 %
FEV6-%Pred-Post: 95 %
FEV6-%Pred-Pre: 103 %
FEV6-Post: 5.34 L
FEV6-Pre: 5.79 L
FEV6FVC-%Pred-Post: 100 %
FEV6FVC-%Pred-Pre: 100 %
FVC-%Change-Post: -7 %
FVC-%Pred-Post: 95 %
FVC-%Pred-Pre: 103 %
FVC-Post: 5.35 L
FVC-Pre: 5.79 L
Post FEV1/FVC ratio: 81 %
Post FEV6/FVC ratio: 100 %
Pre FEV1/FVC ratio: 78 %
Pre FEV6/FVC Ratio: 100 %
RV % pred: 121 %
RV: 1.74 L
TLC % pred: 105 %
TLC: 7.24 L

## 2022-07-24 MED ORDER — PREDNISONE 20 MG PO TABS: ORAL_TABLET | ORAL | 0 refills | Status: DC

## 2022-07-24 NOTE — Patient Instructions (Signed)
I am glad you are doing well off the inhaler Your lung function tests are normal and labs are normal which is good news We will continue to observe you off the regular inhaler I will give you a prescription for prednisone 40 mg a day for 14 days you can take up to 5 days in case you have a flareup when traveling out of the country You do not need to take the prednisone on a regular basis Return to clinic in 6 months

## 2022-07-24 NOTE — Patient Instructions (Signed)
Full PFT performed today. °

## 2022-07-24 NOTE — Progress Notes (Signed)
Brandon Bishop    [id number]    [birth year]  Primary Care Physician:Bedsole, Luberta Robertson, MD  Referring Physician: Excell Seltzer, MD [address] [place],  [place] [address]  Chief complaint: Consult for asthma  HPI: 22 y.o. who  has a past medical history of Allergy, Asthma, Pneumonia of both lungs due to infectious organism (05/23/2020), and Sore throat (05/23/2020).   Diagnosed with asthma around 2020.  Maintained on Flovent and Singulair inhaler with albuterol as needed.  His symptoms are generally under good control with occasional wheezing and coughing.  Seen by primary care with acute exacerbation in December and January with increased dyspnea, wheezing.  Treated with prednisone.  Referred to PCCM for further evaluation  He had been evaluated by allergist in the past and was told that he is sensitive to feathers, grass, pollen, mold and dust  Pets: No pets Occupation: Film/video editor Exposures: Had mold exposure from the basement around 2022.  No ongoing exposures.  No hot tubs Jacuzzis, feather pillows or comforters Smoking history: Never smoker.  No vaping Travel history: No significant travel history Relevant family history: No family history of lung disease  Interim history: Doing well with his breathing.  He is off Qvar and Flovent since it was too expensive into insurance Continue on Singulair.  Hardly needs to use his rescue inhaler  Outpatient Encounter Medications as of 07/24/2022  Medication Sig   albuterol (VENTOLIN HFA) 108 (90 Base) MCG/ACT inhaler Inhale 2 puffs into the lungs every 6 (six) hours as needed for wheezing or shortness of breath.   fluticasone (FLONASE) 50 MCG/ACT nasal spray Place 2 sprays into both nostrils daily.   levocetirizine (XYZAL) 5 MG tablet Take 1 tablet (5 mg total) by mouth every evening.   montelukast (SINGULAIR) 10 MG tablet Take 1 tablet (10 mg total) by mouth daily.   pantoprazole (PROTONIX) 40 MG tablet TAKE ONE TABLET BY  MOUTH DAILY   FLOVENT HFA 220 MCG/ACT inhaler INHALE 2 PUFFS INTO THE LUNGS TWO TIMES A DAY (Patient not taking: Reported on 07/24/2022)   [DISCONTINUED] beclomethasone (QVAR) 80 MCG/ACT inhaler Inhale 2 puffs into the lungs 2 (two) times daily.   No facility-administered encounter medications on file as of 07/24/2022.    Physical Exam: Blood pressure 112/66, pulse 84, temperature 98.4 F (36.9 C), temperature source Oral, height 5\' 10"  (1.778 m), weight 218 lb 3.2 oz (99 kg), SpO2 96 %. Gen:      No acute distress HEENT:  EOMI, sclera anicteric Neck:     No masses; no thyromegaly Lungs:    Clear to auscultation bilaterally; normal respiratory effort CV:         Regular rate and rhythm; no murmurs Abd:      + bowel sounds; soft, non-tender; no palpable masses, no distension Ext:    No edema; adequate peripheral perfusion Skin:      Warm and dry; no rash Neuro: alert and oriented x 3 Psych: normal mood and affect   Data Reviewed: Imaging: Chest x-ray 08/15/2020-no active cardiopulmonary disease. I have reviewed the images personally.  PFTs: 07/24/2022 FVC 5.35 [95%], FEV1 4.32 [92%], F/F81, TLC 7.24 [105%], DLCO 36.90 [112%] Normal test  Labs: CBC 02/23/2022- WBC 10.3, eos 1.5%, platelets 155 IgE 02/23/2022- 9  Assessment:  Moderate persistent asthma Symptoms are well-controlled off Flovent.  Will observe him off controller medications as he is doing well.  Labs and PFTs are normal. Continue Singulair and  albuterol as needed I will give him some prednisone to be held in reserve as he travels frequently for mission work and would need to use it when he is far away from medical services.  If he is stable in 6 months then he can be discharged from pulmonary clinic  Plan/Recommendations: Follow-up in 6 months  Chilton Greathouse MD [facility] 07/24/2022, 2:28 PM  CC: Excell Seltzer, MD

## 2022-07-24 NOTE — Progress Notes (Signed)
Full PFT performed today. °

## 2022-07-24 NOTE — Telephone Encounter (Signed)
Anselm Jungling states needs directions and quantity for Prednisone. [facility] phone number is [phone].

## 2022-07-26 NOTE — Telephone Encounter (Signed)
Spoke with patient's pharmacy [facility] regarding medication Prednisone quantity .Patient was proscribed prednisone 40mg  for 14days with a quantity 14 and it needed to have a quantity of 28 2 20mg  tablet for 14 days. Pharmacy's voice was understanding.Nothing else further needed

## 2022-08-15 ENCOUNTER — Encounter: Payer: Self-pay | Admitting: Family Medicine

## 2022-08-15 DIAGNOSIS — K219 Gastro-esophageal reflux disease without esophagitis: Secondary | ICD-10-CM

## 2022-08-15 MED ORDER — PANTOPRAZOLE SODIUM 40 MG PO TBEC: 40.0000 mg | DELAYED_RELEASE_TABLET | Freq: Every day | ORAL | 1 refills | Status: DC

## 2022-10-17 ENCOUNTER — Other Ambulatory Visit: Payer: Self-pay | Admitting: Pulmonary Disease

## 2022-10-17 DIAGNOSIS — J4530 Mild persistent asthma, uncomplicated: Secondary | ICD-10-CM

## 2023-02-19 ENCOUNTER — Other Ambulatory Visit: Payer: Self-pay | Admitting: Family Medicine

## 2023-02-19 DIAGNOSIS — K219 Gastro-esophageal reflux disease without esophagitis: Secondary | ICD-10-CM

## 2023-02-19 NOTE — Telephone Encounter (Signed)
 Spoke to pt, scheduled cpe for 03/08/23

## 2023-02-19 NOTE — Telephone Encounter (Signed)
Please call and schedule CPE with fasting labs prior with Dr. Ermalene Searing. Patient was suppose to come back in August 2024 for this.

## 2023-03-01 ENCOUNTER — Encounter: Payer: Self-pay | Admitting: Family Medicine

## 2023-03-08 ENCOUNTER — Encounter: Payer: Self-pay | Admitting: Family Medicine

## 2023-03-08 ENCOUNTER — Ambulatory Visit (INDEPENDENT_AMBULATORY_CARE_PROVIDER_SITE_OTHER): Payer: 59 | Admitting: Family Medicine

## 2023-03-08 VITALS — BP 90/72 | HR 90 | Temp 97.7°F | Ht 71.25 in | Wt 238.5 lb

## 2023-03-08 DIAGNOSIS — Z13220 Encounter for screening for lipoid disorders: Secondary | ICD-10-CM

## 2023-03-08 DIAGNOSIS — E66811 Obesity, class 1: Secondary | ICD-10-CM

## 2023-03-08 DIAGNOSIS — R7401 Elevation of levels of liver transaminase levels: Secondary | ICD-10-CM | POA: Insufficient documentation

## 2023-03-08 DIAGNOSIS — Z0000 Encounter for general adult medical examination without abnormal findings: Secondary | ICD-10-CM

## 2023-03-08 DIAGNOSIS — E6609 Other obesity due to excess calories: Secondary | ICD-10-CM

## 2023-03-08 DIAGNOSIS — J4541 Moderate persistent asthma with (acute) exacerbation: Secondary | ICD-10-CM

## 2023-03-08 DIAGNOSIS — Z1159 Encounter for screening for other viral diseases: Secondary | ICD-10-CM

## 2023-03-08 DIAGNOSIS — Z6833 Body mass index (BMI) 33.0-33.9, adult: Secondary | ICD-10-CM

## 2023-03-08 NOTE — Patient Instructions (Signed)
Work on low fat low carb, low fat diet for likely fatty liver.  Start regular exercise and weight management.  Return in 3 months for repeat evaluation of liver test.

## 2023-03-08 NOTE — Progress Notes (Signed)
Patient ID: Brandon Bishop, male    DOB: July 25, 2000, 23 y.o.   MRN: [id number]  This visit was conducted in person.  BP 90/72 (BP Location: Left Arm, Patient Position: Sitting, Cuff Size: Large)   Pulse 90   Temp 97.7 F (36.5 C) (Temporal)   Ht 5' 11.25" (1.81 m)   Wt 238 lb 8 oz (108.2 kg)   SpO2 98%   BMI 33.03 kg/m    CC:  Chief Complaint  Patient presents with   Annual Exam    Subjective:   HPI: Brandon Bishop is a 23 y.o. male presenting on 03/08/2023 for Annual Exam The patient presents for complete physical and review of chronic health problems. He/She also has the following acute concerns today: None   Reviewed labs from [facility].. ALt elevated.  Moderate persistent asthma:  Saw pulmonary 07/2022 Recommended   stopping flFovent... felt better overall. No requirements of albuterol  Allergies with Singulair 10 milligrams daily, Xyzal daily, Flonase   GERD: Well-controlled on pantoprazole 40 mg  as needed  Wt Readings from Last 3 Encounters:  03/08/23 238 lb 8 oz (108.2 kg)  07/24/22 218 lb 3.2 oz (99 kg)  02/23/22 216 lb 6.4 oz (98.2 kg)   Body mass index is 33.03 kg/m.   Diet: trying to work on heart healthy diet... travels a lot and has fast food.  Drinks 32-64 ox water daily.  Exercise: minimal.     Relevant past medical, surgical, family and social history reviewed and updated as indicated. Interim medical history since our last visit reviewed. Allergies and medications reviewed and updated. Outpatient Medications Prior to Visit  Medication Sig Dispense Refill   albuterol (VENTOLIN HFA) 108 (90 Base) MCG/ACT inhaler Inhale 2 puffs into the lungs every 6 (six) hours as needed for wheezing or shortness of breath. 8 g 11   cetirizine (ZYRTEC) 10 MG tablet Take 10 mg by mouth daily.     fluticasone (FLONASE) 50 MCG/ACT nasal spray Place 2 sprays into both nostrils daily.     montelukast (SINGULAIR) 10 MG tablet TAKE 1 TABLET BY MOUTH DAILY 90 tablet 1    pantoprazole (PROTONIX) 40 MG tablet TAKE 1 TABLET BY MOUTH DAILY 90 tablet 0   FLOVENT HFA 220 MCG/ACT inhaler INHALE 2 PUFFS INTO THE LUNGS TWO TIMES A DAY (Patient not taking: Reported on 07/24/2022) 12 g 5   levocetirizine (XYZAL) 5 MG tablet Take 1 tablet (5 mg total) by mouth every evening. 90 tablet 1   predniSONE (DELTASONE) 20 MG tablet Take 40 mg daily for 14 days 14 tablet 0   No facility-administered medications prior to visit.     Per HPI unless specifically indicated in ROS section below Review of Systems  Constitutional:  Negative for fatigue and fever.  HENT:  Negative for ear pain.   Eyes:  Negative for pain.  Respiratory:  Negative for cough and shortness of breath.   Cardiovascular:  Negative for chest pain, palpitations and leg swelling.  Gastrointestinal:  Negative for abdominal pain.  Genitourinary:  Negative for dysuria.  Musculoskeletal:  Negative for arthralgias.  Neurological:  Negative for syncope, light-headedness and headaches.  Psychiatric/Behavioral:  Negative for dysphoric mood.    Objective:  BP 90/72 (BP Location: Left Arm, Patient Position: Sitting, Cuff Size: Large)   Pulse 90   Temp 97.7 F (36.5 C) (Temporal)   Ht 5' 11.25" (1.81 m)   Wt 238 lb 8 oz (108.2 kg)   SpO2 98%   BMI  33.03 kg/m   Wt Readings from Last 3 Encounters:  03/08/23 238 lb 8 oz (108.2 kg)  07/24/22 218 lb 3.2 oz (99 kg)  02/23/22 216 lb 6.4 oz (98.2 kg)      Physical Exam Constitutional:      General: He is not in acute distress.    Appearance: Normal appearance. He is well-developed. He is not ill-appearing or toxic-appearing.  HENT:     Head: Normocephalic and atraumatic.     Right Ear: Hearing, tympanic membrane, ear canal and external ear normal. No tenderness. No foreign body. Tympanic membrane is not retracted or bulging.     Left Ear: Hearing, tympanic membrane, ear canal and external ear normal. No tenderness. No foreign body. Tympanic membrane is not retracted  or bulging.     Nose: Nose normal. No mucosal edema or rhinorrhea.     Right Sinus: No maxillary sinus tenderness or frontal sinus tenderness.     Left Sinus: No maxillary sinus tenderness or frontal sinus tenderness.     Mouth/Throat:     Dentition: Normal dentition. No dental caries.     Pharynx: Uvula midline. No oropharyngeal exudate.     Tonsils: No tonsillar abscesses.  Eyes:     General: Lids are normal. Lids are everted, no foreign bodies appreciated.     Conjunctiva/sclera: Conjunctivae normal.     Pupils: Pupils are equal, round, and reactive to light.  Neck:     Thyroid: No thyroid mass or thyromegaly.     Vascular: No carotid bruit.     Trachea: Trachea and phonation normal.  Cardiovascular:     Rate and Rhythm: Normal rate and regular rhythm.     Pulses: Normal pulses.     Heart sounds: Normal heart sounds, S1 normal and S2 normal. No murmur heard.    No gallop.  Pulmonary:     Effort: Pulmonary effort is normal. No respiratory distress.     Breath sounds: Normal breath sounds. No wheezing, rhonchi or rales.  Abdominal:     General: Bowel sounds are normal.     Palpations: Abdomen is soft.     Tenderness: There is no abdominal tenderness. There is no guarding or rebound.     Hernia: No hernia is present.  Musculoskeletal:     Cervical back: Normal range of motion and neck supple.  Skin:    General: Skin is warm and dry.     Findings: No rash.  Neurological:     Mental Status: He is alert.     Deep Tendon Reflexes: Reflexes are normal and symmetric.  Psychiatric:        Speech: Speech normal.        Behavior: Behavior normal.        Judgment: Judgment normal.       Results for orders placed or performed in visit on 07/24/22  Pulmonary function test   Collection Time: 07/24/22 12:50 PM  Result Value Ref Range   FVC-Pre 5.79 L   FVC-%Pred-Pre 103 %   FVC-Post 5.35 L   FVC-%Pred-Post 95 %   FVC-%Change-Post -7 %   FEV1-Pre 4.54 L   FEV1-%Pred-Pre 97 %    FEV1-Post 4.32 L   FEV1-%Pred-Post 92 %   FEV1-%Change-Post -4 %   FEV6-Pre 5.79 L   FEV6-%Pred-Pre 103 %   FEV6-Post 5.34 L   FEV6-%Pred-Post 95 %   FEV6-%Change-Post -7 %   Pre FEV1/FVC ratio 78 %   FEV1FVC-%Pred-Pre 93 %   Post FEV1/FVC  ratio 81 %   FEV1FVC-%Change-Post 3 %   Pre FEV6/FVC Ratio 100 %   FEV6FVC-%Pred-Pre 100 %   Post FEV6/FVC ratio 100 %   FEV6FVC-%Pred-Post 100 %   FEF 25-75 Pre 4.22 L/sec   FEF2575-%Pred-Pre 85 %   FEF 25-75 Post 3.64 L/sec   FEF2575-%Pred-Post 73 %   FEF2575-%Change-Post -13 %   RV 1.74 L   RV % pred 121 %   TLC 7.24 L   TLC % pred 105 %   DLCO unc 36.90 ml/min/mmHg   DLCO unc % pred 112 %   DLCO cor 36.90 ml/min/mmHg   DLCO cor % pred 112 %   DL/VA 9.14 ml/min/mmHg/L   DL/VA % pred 782 %     COVID 19 screen:  No recent travel or known exposure to COVID19 The patient denies respiratory symptoms of COVID 19 at this time. The importance of social distancing was discussed today.   Assessment and Plan  The patient's preventative maintenance and recommended screening tests for an annual wellness exam were reviewed in full today. Brought up to date unless services declined.  Counselled on the importance of diet, exercise, and its role in overall health and mortality. The patient's FH and SH was reviewed, including their home life, tobacco status, and drug and alcohol status.   Vaccines: refused flu, COVID, Tdap and PNA vaccines Colon: no early family history of colon cancer  Smoking Status: none ETOH/ drug use: none/none  Hep C:  refused  HIV screen:  refused  Routine general medical examination at a health care facility  Moderate persistent asthma with exacerbation  Class 1 obesity due to excess calories without serious comorbidity with body mass index (BMI) of 33.0 to 33.9 in adult  Need for hepatitis C screening test  Screening cholesterol level  Elevated ALT measurement Assessment & Plan:  New .Marland Kitchen No tylenol or ETOH.  No family history of liver issues. 20 lb weight gain in last year.  Likely fatty liver.  Work on low fat low carb, low fat diet for likely fatty liver.  Start regular exercise and weight management.  Return in 3 months for repeat evaluation of liver test.  Orders: -     Hepatitis panel, acute; Future -     Hepatic function panel; Future        Kerby Nora, MD

## 2023-03-08 NOTE — Assessment & Plan Note (Addendum)
New .. No tylenol or ETOH. No family history of liver issues. 20 lb weight gain in last year.  Likely fatty liver.  Work on low fat low carb, low fat diet for likely fatty liver.  Start regular exercise and weight management.  Return in 3 months for repeat evaluation of liver test.

## 2023-05-28 ENCOUNTER — Encounter: Payer: Self-pay | Admitting: Family Medicine

## 2023-05-28 DIAGNOSIS — K219 Gastro-esophageal reflux disease without esophagitis: Secondary | ICD-10-CM

## 2023-05-28 DIAGNOSIS — J4530 Mild persistent asthma, uncomplicated: Secondary | ICD-10-CM

## 2023-05-29 MED ORDER — MONTELUKAST SODIUM 10 MG PO TABS: 10.0000 mg | ORAL_TABLET | Freq: Every day | ORAL | 1 refills | Status: DC

## 2023-05-29 MED ORDER — PANTOPRAZOLE SODIUM 40 MG PO TBEC: 40.0000 mg | DELAYED_RELEASE_TABLET | Freq: Every day | ORAL | 0 refills | Status: DC

## 2023-06-05 ENCOUNTER — Other Ambulatory Visit: Payer: 59

## 2023-07-16 ENCOUNTER — Ambulatory Visit: Payer: Self-pay | Admitting: Family Medicine

## 2023-07-16 ENCOUNTER — Other Ambulatory Visit (INDEPENDENT_AMBULATORY_CARE_PROVIDER_SITE_OTHER)

## 2023-07-16 DIAGNOSIS — R7401 Elevation of levels of liver transaminase levels: Secondary | ICD-10-CM

## 2023-07-16 LAB — HEPATIC FUNCTION PANEL
ALT: 37 U/L (ref 0–53)
AST: 20 U/L (ref 0–37)
Albumin: 4.6 g/dL (ref 3.5–5.2)
Alkaline Phosphatase: 87 U/L (ref 39–117)
Bilirubin, Direct: 0.1 mg/dL (ref 0.0–0.3)
Total Bilirubin: 0.6 mg/dL (ref 0.2–1.2)
Total Protein: 7.5 g/dL (ref 6.0–8.3)

## 2023-07-17 LAB — HEPATITIS PANEL, ACUTE
Hep A IgM: NONREACTIVE
Hep B C IgM: NONREACTIVE
Hepatitis B Surface Ag: NONREACTIVE
Hepatitis C Ab: NONREACTIVE

## 2023-08-25 ENCOUNTER — Other Ambulatory Visit: Payer: Self-pay | Admitting: Family Medicine

## 2023-08-25 DIAGNOSIS — K219 Gastro-esophageal reflux disease without esophagitis: Secondary | ICD-10-CM

## 2023-11-25 ENCOUNTER — Other Ambulatory Visit: Payer: Self-pay | Admitting: Family Medicine

## 2023-11-25 DIAGNOSIS — J4530 Mild persistent asthma, uncomplicated: Secondary | ICD-10-CM
